# Patient Record
Sex: Male | Born: 1950
Health system: Southern US, Community
[De-identification: ages and names within clinical notes are randomized; demographics above are authoritative.]

## PROBLEM LIST (undated history)

## (undated) DIAGNOSIS — I10 Essential (primary) hypertension: Secondary | ICD-10-CM

## (undated) DIAGNOSIS — Z87891 Personal history of nicotine dependence: Secondary | ICD-10-CM

## (undated) DIAGNOSIS — E785 Hyperlipidemia, unspecified: Secondary | ICD-10-CM

## (undated) HISTORY — DX: Personal history of nicotine dependence: Z87.891

## (undated) HISTORY — PX: KNEE ARTHROSCOPY: SHX127

## (undated) HISTORY — PX: COLONOSCOPY: SHX174

## (undated) HISTORY — DX: Essential (primary) hypertension: I10

## (undated) HISTORY — PX: TONSILLECTOMY: SUR1361

---

## 2006-07-23 ENCOUNTER — Ambulatory Visit: Payer: Self-pay | Admitting: Gastroenterology

## 2009-09-09 ENCOUNTER — Ambulatory Visit: Payer: Self-pay | Admitting: Gastroenterology

## 2012-12-02 ENCOUNTER — Ambulatory Visit: Payer: Self-pay | Admitting: Gastroenterology

## 2012-12-03 LAB — PATHOLOGY REPORT

## 2013-09-18 DIAGNOSIS — T7840XA Allergy, unspecified, initial encounter: Secondary | ICD-10-CM

## 2013-09-18 HISTORY — DX: Allergy, unspecified, initial encounter: T78.40XA

## 2015-06-18 ENCOUNTER — Encounter: Payer: Self-pay | Admitting: *Deleted

## 2015-06-21 ENCOUNTER — Ambulatory Visit: Payer: Medicare Other | Admitting: Registered Nurse

## 2015-06-21 ENCOUNTER — Ambulatory Visit
Admission: RE | Admit: 2015-06-21 | Discharge: 2015-06-21 | Disposition: A | Discharge status: Home / Self Care | Payer: Medicare Other | Source: Ambulatory Visit | Attending: Gastroenterology | Admitting: Gastroenterology

## 2015-06-21 ENCOUNTER — Encounter
Admission: RE | Disposition: A | Discharge status: Home / Self Care | Payer: Self-pay | Source: Ambulatory Visit | Attending: Gastroenterology

## 2015-06-21 DIAGNOSIS — Z79899 Other long term (current) drug therapy: Secondary | ICD-10-CM | POA: Diagnosis not present

## 2015-06-21 DIAGNOSIS — K5730 Diverticulosis of large intestine without perforation or abscess without bleeding: Secondary | ICD-10-CM | POA: Insufficient documentation

## 2015-06-21 DIAGNOSIS — D124 Benign neoplasm of descending colon: Secondary | ICD-10-CM | POA: Diagnosis not present

## 2015-06-21 DIAGNOSIS — Z86010 Personal history of colonic polyps: Secondary | ICD-10-CM | POA: Insufficient documentation

## 2015-06-21 DIAGNOSIS — I10 Essential (primary) hypertension: Secondary | ICD-10-CM | POA: Insufficient documentation

## 2015-06-21 DIAGNOSIS — Z91030 Bee allergy status: Secondary | ICD-10-CM | POA: Insufficient documentation

## 2015-06-21 DIAGNOSIS — E785 Hyperlipidemia, unspecified: Secondary | ICD-10-CM | POA: Diagnosis not present

## 2015-06-21 DIAGNOSIS — K621 Rectal polyp: Secondary | ICD-10-CM | POA: Insufficient documentation

## 2015-06-21 DIAGNOSIS — D123 Benign neoplasm of transverse colon: Secondary | ICD-10-CM | POA: Diagnosis not present

## 2015-06-21 DIAGNOSIS — Z7982 Long term (current) use of aspirin: Secondary | ICD-10-CM | POA: Insufficient documentation

## 2015-06-21 HISTORY — DX: Hyperlipidemia, unspecified: E78.5

## 2015-06-21 HISTORY — DX: Essential (primary) hypertension: I10

## 2015-06-21 HISTORY — PX: COLONOSCOPY WITH PROPOFOL: SHX5780

## 2015-06-21 SURGERY — COLONOSCOPY WITH PROPOFOL
Anesthesia: General

## 2015-06-21 MED ORDER — SODIUM CHLORIDE 0.9 % IV SOLN: INTRAVENOUS | Status: DC

## 2015-06-21 MED ORDER — PROPOFOL 500 MG/50ML IV EMUL
INTRAVENOUS | Status: DC | PRN
  Administered 2015-06-21: 120 ug/kg/min via INTRAVENOUS

## 2015-06-21 MED ORDER — FENTANYL CITRATE (PF) 100 MCG/2ML IJ SOLN
INTRAMUSCULAR | Status: DC | PRN
  Administered 2015-06-21: 50 ug via INTRAVENOUS

## 2015-06-21 MED ORDER — MIDAZOLAM HCL 2 MG/2ML IJ SOLN: INTRAMUSCULAR | Status: DC | PRN

## 2015-06-21 MED ORDER — MIDAZOLAM HCL 2 MG/2ML IJ SOLN
INTRAMUSCULAR | Status: DC | PRN
  Administered 2015-06-21: 1 mg via INTRAVENOUS

## 2015-06-21 MED ORDER — SODIUM CHLORIDE 0.9 % IV SOLN
INTRAVENOUS | Status: DC
  Administered 2015-06-21: 1000 mL via INTRAVENOUS
  Administered 2015-06-21: 13:00:00 via INTRAVENOUS

## 2015-06-21 NOTE — Transfer of Care (Signed)
Immediate Anesthesia Transfer of Care Note  Patient: Jim Bauer  Procedure(s) Performed: Procedure(s): COLONOSCOPY WITH PROPOFOL (N/A)  Patient Location: PACU  Anesthesia Type:General  Level of Consciousness: awake  Airway & Oxygen Therapy: Patient Spontanous Breathing and Patient connected to nasal cannula oxygen  Post-op Assessment: Report given to RN  Post vital signs: stable  Last Vitals:  Filed Vitals:   06/21/15 1242  BP: 143/88  Pulse: 75  Temp: 36.2 C  Resp: 17    Complications: No apparent anesthesia complications

## 2015-06-21 NOTE — Op Note (Signed)
[facility] Gastroenterology Patient Name: Jim Bauer Procedure Date: 06/21/2015 1:06 PM MRN: [id number] Account #: [id number] Date of Birth: [birth year] Admit Type: Outpatient Age: 64 Room: [facility] ENDO ROOM 2 Gender: Male Note Status: Finalized Procedure:         Colonoscopy Indications:       Personal history of colonic polyps Providers:         Lollie Sails, MD Referring MD:      Ramonita Lab, MD (Referring MD) Medicines:         Monitored Anesthesia Care Complications:     No immediate complications. Procedure:         Pre-Anesthesia Assessment:                    - ASA Grade Assessment: II - A patient with mild systemic                     disease.                    After obtaining informed consent, the colonoscope was                     passed under direct vision. Throughout the procedure, the                     patient's blood pressure, pulse, and oxygen saturations                     were monitored continuously. The Colonoscope was                     introduced through the anus and advanced to the the cecum,                     identified by appendiceal orifice and ileocecal valve. The                     colonoscopy was performed without difficulty. The patient                     tolerated the procedure well. The quality of the bowel                     preparation was good. Findings:      Multiple small-mouthed diverticula were found in the sigmoid colon and       in the descending colon.      A 3 to 4 mm polyp was found in the descending colon. The polyp was flat.       The polyp was removed with a cold snare. Resection and retrieval were       complete.      Four sessile polyps were found at the splenic flexure. The polyps were 3       to 4 mm in size. These polyps were removed with a cold snare. Resection       and retrieval were complete.      A 1 mm polyp was found in the rectum. The polyp was sessile. The polyp       was removed with  a cold biopsy forceps. Resection and retrieval were       complete.      The retroflexed view of the distal rectum and anal verge was normal and  showed no anal or rectal abnormalities.      The digital rectal exam was normal. Impression:        - Diverticulosis in the sigmoid colon and in the                     descending colon.                    - One 3 to 4 mm polyp in the descending colon. Resected                     and retrieved.                    - Four 3 to 4 mm polyps at the splenic flexure. Resected                     and retrieved.                    - One 1 mm polyp in the rectum. Resected and retrieved.                    - The distal rectum and anal verge are normal on                     retroflexion view. Recommendation:    - Await pathology results.                    - Telephone GI clinic for pathology results in 1 week. Procedure Code(s): --- Professional ---                    [phone], Colonoscopy, flexible; with removal of tumor(s),                     polyp(s), or other lesion(s) by snare technique                    45380, 51, Colonoscopy, flexible; with biopsy, single or                     multiple Diagnosis Code(s): --- Professional ---                    211.3, Benign neoplasm of colon                    569.0, Anal and rectal polyp                    V12.72, Personal history of colonic polyps                    562.10, Diverticulosis of colon (without mention of                     hemorrhage) CPT copyright 2014 American Medical Association. All rights reserved. The codes documented in this report are preliminary and upon coder review may  be revised to meet current compliance requirements. Lollie Sails, MD 06/21/2015 1:42:39 PM This report has been signed electronically. Number of Addenda: 0 Note Initiated On: 06/21/2015 1:06 PM Scope Withdrawal Time: 0 hours 15 minutes 35 seconds  Total Procedure Duration: 0 hours 27 minutes 28 seconds        [facility]

## 2015-06-21 NOTE — H&P (Signed)
Outpatient short stay form Pre-procedure 06/21/2015 1:07 PM Lollie Sails MD  Primary Physician: Dr. Ramonita Lab  Reason for visit:  Colonoscopy  History of present illness:  Patient is a 64 year old male with a personal history of adenomatous colon polyps. His last colonoscopy was 12/02/2012 at which time several adenomas were removed one of these being 16 mm in size. He tolerated his prep well for the procedure. He takes no anticoagulation products. He does take a minidose aspirin.    Current facility-administered medications:  .  0.9 %  sodium chloride infusion, , Intravenous, Continuous, Lollie Sails, MD, Last Rate: 20 mL/hr at 06/21/15 1300, 1,000 mL at 06/21/15 1300 .  0.9 %  sodium chloride infusion, , Intravenous, Continuous, Lollie Sails, MD  Prescriptions prior to admission  Medication Sig Dispense Refill Last Dose  . aspirin EC 81 MG tablet Take 81 mg by mouth daily.     . calcium-vitamin D (OSCAL WITH D) 500-200 MG-UNIT tablet Take 1 tablet by mouth.     . Omega-3 Fatty Acids (OMEGA-3 FISH OIL) 300 MG CAPS Take by mouth.     . simvastatin (ZOCOR) 80 MG tablet Take 80 mg by mouth daily.        Allergies  Allergen Reactions  . Bee Venom Swelling     Past Medical History  Diagnosis Date  . Hypertension   . Hyperlipidemia     Review of systems:      Physical Exam    Heart and lungs: Regular rate and rhythm without rub or gallop, lungs are bilaterally clear    HEENT: Normocephalic atraumatic eyes are anicteric    Other:     Pertinant exam for procedure: Soft nontender nondistended bowel sounds positive normoactive    Planned proceedures: Colonoscopy and indicated procedures I have discussed the risks benefits and complications of procedures to include not limited to bleeding, infection, perforation and the risk of sedation and the patient wishes to proceed.    Lollie Sails, MD Gastroenterology 06/21/2015  1:07 PM

## 2015-06-21 NOTE — Anesthesia Preprocedure Evaluation (Signed)
Anesthesia Evaluation  Patient identified by MRN, date of birth, ID band Patient awake    Reviewed: Allergy & Precautions, H&P , NPO status , Patient's Chart, lab work & pertinent test results, reviewed documented beta blocker date and time   Airway Mallampati: II  TM Distance: >3 FB Neck ROM: full    Dental no notable dental hx.    Pulmonary neg pulmonary ROS, Current Smoker,    Pulmonary exam normal breath sounds clear to auscultation       Cardiovascular Exercise Tolerance: Good hypertension, negative cardio ROS   Rhythm:regular Rate:Normal     Neuro/Psych negative neurological ROS  negative psych ROS   GI/Hepatic negative GI ROS, Neg liver ROS,   Endo/Other  negative endocrine ROS  Renal/GU negative Renal ROS  negative genitourinary   Musculoskeletal   Abdominal   Peds  Hematology negative hematology ROS (+)   Anesthesia Other Findings   Reproductive/Obstetrics negative OB ROS                             Anesthesia Physical Anesthesia Plan  ASA: II  Anesthesia Plan: General   Post-op Pain Management:    Induction:   Airway Management Planned:   Additional Equipment:   Intra-op Plan:   Post-operative Plan:   Informed Consent: I have reviewed the patients History and Physical, chart, labs and discussed the procedure including the risks, benefits and alternatives for the proposed anesthesia with the patient or authorized representative who has indicated his/her understanding and acceptance.   Dental Advisory Given  Plan Discussed with: CRNA  Anesthesia Plan Comments:         Anesthesia Quick Evaluation

## 2015-06-22 ENCOUNTER — Encounter: Payer: Self-pay | Admitting: Gastroenterology

## 2015-06-22 LAB — SURGICAL PATHOLOGY

## 2015-06-22 NOTE — Anesthesia Postprocedure Evaluation (Signed)
  Anesthesia Post-op Note  Patient: Jim Bauer  Procedure(s) Performed: Procedure(s): COLONOSCOPY WITH PROPOFOL (N/A)  Anesthesia type:General  Patient location: PACU  Post pain: Pain level controlled  Post assessment: Post-op Vital signs reviewed, Patient's Cardiovascular Status Stable, Respiratory Function Stable, Patent Airway and No signs of Nausea or vomiting  Post vital signs: Reviewed and stable  Last Vitals:  Filed Vitals:   06/21/15 1410  BP: 155/78  Pulse: 57  Temp:   Resp: 13    Level of consciousness: awake, alert  and patient cooperative  Complications: No apparent anesthesia complications

## 2015-08-18 ENCOUNTER — Other Ambulatory Visit: Payer: Self-pay | Admitting: Internal Medicine

## 2015-08-18 DIAGNOSIS — Z136 Encounter for screening for cardiovascular disorders: Secondary | ICD-10-CM

## 2015-08-18 DIAGNOSIS — Z720 Tobacco use: Secondary | ICD-10-CM

## 2015-08-18 DIAGNOSIS — I10 Essential (primary) hypertension: Secondary | ICD-10-CM

## 2015-08-20 ENCOUNTER — Other Ambulatory Visit: Payer: Self-pay | Admitting: Family Medicine

## 2015-08-20 ENCOUNTER — Inpatient Hospital Stay: Payer: Medicare Other | Attending: Family Medicine | Admitting: Family Medicine

## 2015-08-20 ENCOUNTER — Encounter: Payer: Self-pay | Admitting: Family Medicine

## 2015-08-20 ENCOUNTER — Ambulatory Visit
Admission: RE | Admit: 2015-08-20 | Discharge: 2015-08-20 | Disposition: A | Discharge status: Home / Self Care | Payer: Medicare Other | Source: Ambulatory Visit | Attending: Family Medicine | Admitting: Family Medicine

## 2015-08-20 DIAGNOSIS — Z87891 Personal history of nicotine dependence: Secondary | ICD-10-CM

## 2015-08-20 DIAGNOSIS — Z122 Encounter for screening for malignant neoplasm of respiratory organs: Secondary | ICD-10-CM | POA: Diagnosis not present

## 2015-08-20 DIAGNOSIS — J439 Emphysema, unspecified: Secondary | ICD-10-CM | POA: Diagnosis not present

## 2015-08-20 DIAGNOSIS — I2510 Atherosclerotic heart disease of native coronary artery without angina pectoris: Secondary | ICD-10-CM | POA: Diagnosis not present

## 2015-08-20 HISTORY — DX: Personal history of nicotine dependence: Z87.891

## 2015-08-20 NOTE — Progress Notes (Signed)
In accordance with CMS guidelines, patient has meet eligibility criteria including age, absence of signs or symptoms of lung cancer, the specific calculation of cigarette smoking pack-years was 40 years and is a current smoker.   A shared decision-making session was conducted prior to the performance of CT scan. This includes one or more decision aids, includes benefits and harms of screening, follow-up diagnostic testing, over-diagnosis, false positive rate, and total radiation exposure.  Counseling on the importance of adherence to annual lung cancer LDCT screening, impact of co-morbidities, and ability or willingness to undergo diagnosis and treatment is imperative for compliance of the program.  Counseling on the importance of continued smoking cessation for former smokers; the importance of smoking cessation for current smokers and information about tobacco cessation interventions have been given to patient including the [place] at [facility], [address] quit [place], as well as Cancer Center specific smoking cessation programs.  Written order for lung cancer screening with LDCT has been given to the patient and any and all questions have been answered to the best of my abilities.   Yearly follow up will be scheduled by Shawn Perkins, Thoracic Navigator.   

## 2015-08-23 ENCOUNTER — Telehealth: Payer: Self-pay | Admitting: *Deleted

## 2015-08-23 NOTE — Telephone Encounter (Signed)
Notified patient of LDCT lung cancer screening results of Lung Rads 2S finding with recommendation for 12 month follow up imaging. Also notified of incidental finding noted below. Patient verbalizes understanding.   IMPRESSION: 1. Lung-RADS Category 2S, benign appearance or behavior. Continue annual screening with low-dose chest CT without contrast in 12 months. 2. The "S" modifier above refers to potentially clinically significant non lung cancer related findings. Specifically, there is atherosclerosis, including left anterior descending coronary artery disease. Please note that although the presence of coronary artery calcium documents the presence of coronary artery disease, the severity of this disease and any potential stenosis cannot be assessed on this non-gated CT examination. Assessment for potential risk factor modification, dietary therapy or pharmacologic therapy may be warranted, if clinically indicated. 3. Mild diffuse bronchial wall thickening with mild centrilobular and paraseptal emphysema; imaging findings suggestive of underlying COPD. 4. Additional incidental findings, as above.

## 2015-08-24 ENCOUNTER — Ambulatory Visit
Admission: RE | Admit: 2015-08-24 | Discharge: 2015-08-24 | Disposition: A | Discharge status: Home / Self Care | Payer: Medicare Other | Source: Ambulatory Visit | Attending: Internal Medicine | Admitting: Internal Medicine

## 2015-08-24 DIAGNOSIS — I10 Essential (primary) hypertension: Secondary | ICD-10-CM | POA: Diagnosis present

## 2015-08-24 DIAGNOSIS — Z136 Encounter for screening for cardiovascular disorders: Secondary | ICD-10-CM | POA: Diagnosis present

## 2015-08-24 DIAGNOSIS — I77811 Abdominal aortic ectasia: Secondary | ICD-10-CM | POA: Insufficient documentation

## 2015-08-24 DIAGNOSIS — Z720 Tobacco use: Secondary | ICD-10-CM | POA: Diagnosis present

## 2015-09-08 NOTE — Addendum Note (Signed)
Addendum  created 09/08/15 1304 by Iver Nestle, MD   Modules edited: Anesthesia Attestations

## 2016-08-17 ENCOUNTER — Telehealth: Payer: Self-pay | Admitting: *Deleted

## 2016-08-17 NOTE — Telephone Encounter (Signed)
Notified patient that annual lung cancer screening low dose CT scan is due. Confirmed that patient is within the age range of 55-77, and asymptomatic, (no signs or symptoms of lung cancer). Patient denies illness that would prevent curative treatment for lung cancer if found. The patient is a current smoker, with a 41 pack year history. The shared decision making visit was done 08/20/15. Patient is agreeable for CT scan being scheduled. Information sent to business office.

## 2016-09-14 ENCOUNTER — Other Ambulatory Visit: Payer: Self-pay | Admitting: *Deleted

## 2016-09-14 DIAGNOSIS — Z87891 Personal history of nicotine dependence: Secondary | ICD-10-CM

## 2016-10-04 ENCOUNTER — Ambulatory Visit: Admission: RE | Admit: 2016-10-04 | Payer: Medicare Other | Source: Ambulatory Visit

## 2016-10-11 ENCOUNTER — Ambulatory Visit
Admission: RE | Admit: 2016-10-11 | Discharge: 2016-10-11 | Disposition: A | Discharge status: Home / Self Care | Payer: Medicare Other | Source: Ambulatory Visit | Attending: Oncology | Admitting: Oncology

## 2016-10-11 DIAGNOSIS — J432 Centrilobular emphysema: Secondary | ICD-10-CM | POA: Insufficient documentation

## 2016-10-11 DIAGNOSIS — R918 Other nonspecific abnormal finding of lung field: Secondary | ICD-10-CM | POA: Insufficient documentation

## 2016-10-11 DIAGNOSIS — I2510 Atherosclerotic heart disease of native coronary artery without angina pectoris: Secondary | ICD-10-CM | POA: Insufficient documentation

## 2016-10-11 DIAGNOSIS — I700 Atherosclerosis of aorta: Secondary | ICD-10-CM | POA: Diagnosis not present

## 2016-10-11 DIAGNOSIS — Z87891 Personal history of nicotine dependence: Secondary | ICD-10-CM | POA: Insufficient documentation

## 2016-10-20 ENCOUNTER — Encounter: Payer: Self-pay | Admitting: *Deleted

## 2017-10-11 ENCOUNTER — Telehealth: Payer: Self-pay | Admitting: *Deleted

## 2017-10-11 DIAGNOSIS — Z87891 Personal history of nicotine dependence: Secondary | ICD-10-CM

## 2017-10-11 DIAGNOSIS — Z122 Encounter for screening for malignant neoplasm of respiratory organs: Secondary | ICD-10-CM

## 2017-10-11 NOTE — Telephone Encounter (Signed)
Notified patient that annual lung cancer screening low dose CT scan is due currently or will be in near future. Confirmed that patient is within the age range of 55-77, and asymptomatic, (no signs or symptoms of lung cancer). Patient denies illness that would prevent curative treatment for lung cancer if found. Verified smoking history, (current, 42 pack year). The shared decision making visit was done 08/20/15. Patient is agreeable for CT scan being scheduled.

## 2017-10-22 ENCOUNTER — Ambulatory Visit
Admission: RE | Admit: 2017-10-22 | Discharge: 2017-10-22 | Disposition: A | Discharge status: Home / Self Care | Payer: Medicare Other | Source: Ambulatory Visit | Attending: Nurse Practitioner | Admitting: Nurse Practitioner

## 2017-10-22 DIAGNOSIS — J439 Emphysema, unspecified: Secondary | ICD-10-CM | POA: Diagnosis not present

## 2017-10-22 DIAGNOSIS — I700 Atherosclerosis of aorta: Secondary | ICD-10-CM | POA: Insufficient documentation

## 2017-10-22 DIAGNOSIS — Z122 Encounter for screening for malignant neoplasm of respiratory organs: Secondary | ICD-10-CM | POA: Insufficient documentation

## 2017-10-22 DIAGNOSIS — Z87891 Personal history of nicotine dependence: Secondary | ICD-10-CM | POA: Diagnosis present

## 2017-10-29 ENCOUNTER — Encounter: Payer: Self-pay | Admitting: *Deleted

## 2018-10-11 ENCOUNTER — Telehealth: Payer: Self-pay

## 2018-10-11 NOTE — Telephone Encounter (Signed)
Call pt regarding lung screening. Pt is a current smoker, smoking between 1/2 to 1 pack per day. Scan would be better if on a Wednesday or Friday anytime. Pt has no new health issues at this time.

## 2018-10-12 ENCOUNTER — Telehealth: Payer: Self-pay | Admitting: *Deleted

## 2018-10-12 ENCOUNTER — Encounter: Payer: Self-pay | Admitting: *Deleted

## 2018-10-12 DIAGNOSIS — Z122 Encounter for screening for malignant neoplasm of respiratory organs: Secondary | ICD-10-CM

## 2018-10-12 NOTE — Telephone Encounter (Signed)
Patient has been notified that the annual lung cancer screening low dose CT scan is due currently or will be in the near future.  Confirmed that the patient is within the age range of 72-80, and asymptomatic, and currently exhibits no signs or symptoms of lung cancer.  Patient denies illness that would prevent curative treatment for lung cancer if found.  Verified smoking history, current smoker 1 ppd with 43pkyr history   The shared decision making visit was completed on 08-20-15.  Patient is agreeable for the CT scan to be scheduled.  Will call patient back with date and time of appointment.

## 2018-10-15 ENCOUNTER — Telehealth: Payer: Self-pay | Admitting: *Deleted

## 2018-10-15 NOTE — Telephone Encounter (Signed)
Called pt to inform him of his appt for ldct screening on Wednesday 10/23/2018 @ 1:35pm here @ [facility], voiced understanding.

## 2018-10-23 ENCOUNTER — Ambulatory Visit
Admission: RE | Admit: 2018-10-23 | Discharge: 2018-10-23 | Disposition: A | Discharge status: Home / Self Care | Payer: Medicare Other | Source: Ambulatory Visit | Attending: Oncology | Admitting: Oncology

## 2018-10-23 DIAGNOSIS — Z122 Encounter for screening for malignant neoplasm of respiratory organs: Secondary | ICD-10-CM | POA: Diagnosis present

## 2018-10-24 ENCOUNTER — Encounter: Payer: Self-pay | Admitting: *Deleted

## 2018-10-25 ENCOUNTER — Encounter: Payer: Self-pay | Admitting: *Deleted

## 2018-11-15 ENCOUNTER — Encounter: Payer: Self-pay | Admitting: *Deleted

## 2019-02-17 ENCOUNTER — Inpatient Hospital Stay: Admission: RE | Admit: 2019-02-17 | Payer: Medicare Other | Source: Ambulatory Visit

## 2019-02-17 ENCOUNTER — Telehealth: Payer: Medicare Other

## 2019-02-18 ENCOUNTER — Inpatient Hospital Stay: Admission: RE | Admit: 2019-02-18 | Payer: Medicare Other | Source: Ambulatory Visit

## 2019-02-18 ENCOUNTER — Ambulatory Visit (INDEPENDENT_AMBULATORY_CARE_PROVIDER_SITE_OTHER)
Admission: RE | Admit: 2019-02-18 | Discharge: 2019-02-18 | Disposition: A | Discharge status: Home / Self Care | Payer: Medicare Other | Source: Ambulatory Visit

## 2019-02-18 DIAGNOSIS — E785 Hyperlipidemia, unspecified: Secondary | ICD-10-CM | POA: Diagnosis not present

## 2019-02-18 MED ORDER — SIMVASTATIN 80 MG PO TABS: 80.0000 mg | ORAL_TABLET | Freq: Every day | ORAL | 0 refills | Status: DC

## 2019-02-18 NOTE — Discharge Instructions (Signed)
We refilled your cholesterol medication. I put 2 contacts on your discharge instructions for primary care follow-ups If you are unable to get in within the next 3 months to be established you can follow up with [place] as needed.

## 2019-02-18 NOTE — ED Provider Notes (Signed)
Virtual Visit via Video Note:  Josuel Koeppen  initiated request for Telemedicine visit with [facility] Urgent [facility] team. I connected with [name]  on 02/18/2019 at 2:14 PM  for a synchronized telemedicine visit using a video enabled HIPPA compliant telemedicine application. I verified that I am speaking with [name]  using two identifiers. Orvan July, NP  was physically located in a [facility] Urgent care site and Shaheim Mahar was located at a different location.   The limitations of evaluation and management by telemedicine as well as the availability of in-person appointments were discussed. Patient was informed that he  may incur a bill ( including co-pay) for this virtual visit encounter. Brady Peter  expressed understanding and gave verbal consent to proceed with virtual visit.     History of Present Illness:Jim Bauer  is a 68 y.o. male presents for medication refill.  He is out of his cholesterol medicine.  He takes 80 mg of simvastatin daily.  He currently does not have a PCP.  He denies any worrisome symptoms.  He reports overall feeling well.  He is requesting PCP referral  Past Medical History:  Diagnosis Date  . Hyperlipidemia   . Hypertension   . Personal history of tobacco use, presenting hazards to health 08/20/2015    Allergies  Allergen Reactions  . Bee Venom Swelling        Observations/Objective:GENERAL APPEARANCE: Well developed, well nourished, alert and cooperative, and appears to be in no acute distress. HEAD: normocephalic. Non labored breathing, no dyspnea or distress Skin: Skin normal color  PSYCHIATRIC: The mental examination revealed the patient was oriented to person, place, and time. The patient was able to demonstrate good judgement and reason, without hallucinations, abnormal affect or abnormal behaviors during the examination. Patient is not suicidal     Assessment and Plan: Refilled patient's cholesterol medication.  2  contacts placed on discharge instructions for PCP follow-up     Follow Up Instructions: Follow up as needed for continued or worsening symptoms     I discussed the assessment and treatment plan with the patient. The patient was provided an opportunity to ask questions and all were answered. The patient agreed with the plan and demonstrated an understanding of the instructions.   The patient was advised to call back or seek an in-person evaluation if the symptoms worsen or if the condition fails to improve as anticipated.    Orvan July, NP  02/18/2019 2:14 PM         Orvan July, NP 02/19/19 2952

## 2019-04-10 ENCOUNTER — Encounter: Payer: Self-pay | Admitting: Nurse Practitioner

## 2019-04-10 DIAGNOSIS — I10 Essential (primary) hypertension: Secondary | ICD-10-CM | POA: Insufficient documentation

## 2019-04-10 DIAGNOSIS — F17210 Nicotine dependence, cigarettes, uncomplicated: Secondary | ICD-10-CM | POA: Insufficient documentation

## 2019-04-10 DIAGNOSIS — R7303 Prediabetes: Secondary | ICD-10-CM | POA: Insufficient documentation

## 2019-04-10 DIAGNOSIS — I700 Atherosclerosis of aorta: Secondary | ICD-10-CM | POA: Insufficient documentation

## 2019-04-10 DIAGNOSIS — R918 Other nonspecific abnormal finding of lung field: Secondary | ICD-10-CM | POA: Insufficient documentation

## 2019-04-10 DIAGNOSIS — E785 Hyperlipidemia, unspecified: Secondary | ICD-10-CM | POA: Insufficient documentation

## 2019-04-10 DIAGNOSIS — E559 Vitamin D deficiency, unspecified: Secondary | ICD-10-CM | POA: Insufficient documentation

## 2019-04-10 DIAGNOSIS — Z860100 Personal history of colon polyps, unspecified: Secondary | ICD-10-CM | POA: Insufficient documentation

## 2019-04-21 ENCOUNTER — Ambulatory Visit (INDEPENDENT_AMBULATORY_CARE_PROVIDER_SITE_OTHER): Payer: Medicare Other | Admitting: Nurse Practitioner

## 2019-04-21 ENCOUNTER — Other Ambulatory Visit: Payer: Self-pay

## 2019-04-21 ENCOUNTER — Encounter: Payer: Self-pay | Admitting: Nurse Practitioner

## 2019-04-21 VITALS — BP 136/84 | HR 65 | Temp 99.2°F

## 2019-04-21 DIAGNOSIS — E782 Mixed hyperlipidemia: Secondary | ICD-10-CM | POA: Diagnosis not present

## 2019-04-21 DIAGNOSIS — F17210 Nicotine dependence, cigarettes, uncomplicated: Secondary | ICD-10-CM

## 2019-04-21 DIAGNOSIS — I700 Atherosclerosis of aorta: Secondary | ICD-10-CM

## 2019-04-21 DIAGNOSIS — Z1211 Encounter for screening for malignant neoplasm of colon: Secondary | ICD-10-CM

## 2019-04-21 DIAGNOSIS — I10 Essential (primary) hypertension: Secondary | ICD-10-CM | POA: Diagnosis not present

## 2019-04-21 DIAGNOSIS — R918 Other nonspecific abnormal finding of lung field: Secondary | ICD-10-CM | POA: Diagnosis not present

## 2019-04-21 MED ORDER — SIMVASTATIN 80 MG PO TABS: 80.0000 mg | ORAL_TABLET | Freq: Every day | ORAL | 3 refills | Status: DC

## 2019-04-21 NOTE — Assessment & Plan Note (Signed)
Noted on lung CT scan.  Continue daily ASA and Simvastatin.  Recommend complete smoking cessation.

## 2019-04-21 NOTE — Assessment & Plan Note (Signed)
I have recommended complete cessation of tobacco use. I have discussed various options available for assistance with tobacco cessation including over the counter methods (Nicotine gum, patch and lozenges). We also discussed prescription options (Chantix, Nicotine Inhaler / Nasal Spray). The patient is not interested in pursuing any prescription tobacco cessation options at this time.  

## 2019-04-21 NOTE — Patient Instructions (Signed)
Fat and Cholesterol Restricted Eating Plan Getting too much fat and cholesterol in your diet may cause health problems. Choosing the right foods helps keep your fat and cholesterol at normal levels. This can keep you from getting certain diseases. Your doctor may recommend an eating plan that includes:  Total fat: ______% or less of total calories a day.  Saturated fat: ______% or less of total calories a day.  Cholesterol: less than _________mg a day.  Fiber: ______g a day. What are tips for following this plan? Meal planning  At meals, divide your plate into four equal parts: ? Fill one-half of your plate with vegetables and green salads. ? Fill one-fourth of your plate with whole grains. ? Fill one-fourth of your plate with low-fat (lean) protein foods.  Eat fish that is high in omega-3 fats at least two times a week. This includes mackerel, tuna, sardines, and salmon.  Eat foods that are high in fiber, such as whole grains, beans, apples, broccoli, carrots, peas, and barley. General tips   Work with your doctor to lose weight if you need to.  Avoid: ? Foods with added sugar. ? Fried foods. ? Foods with partially hydrogenated oils.  Limit alcohol intake to no more than 1 drink a day for nonpregnant women and 2 drinks a day for men. One drink equals 12 oz of beer, 5 oz of wine, or 1 oz of hard liquor. Reading food labels  Check food labels for: ? Trans fats. ? Partially hydrogenated oils. ? Saturated fat (g) in each serving. ? Cholesterol (mg) in each serving. ? Fiber (g) in each serving.  Choose foods with healthy fats, such as: ? Monounsaturated fats. ? Polyunsaturated fats. ? Omega-3 fats.  Choose grain products that have whole grains. Look for the word "whole" as the first word in the ingredient list. Cooking  Cook foods using low-fat methods. These include baking, boiling, grilling, and broiling.  Eat more home-cooked foods. Eat at restaurants and buffets  less often.  Avoid cooking using saturated fats, such as butter, cream, palm oil, palm kernel oil, and coconut oil. Recommended foods  Fruits  All fresh, canned (in natural juice), or frozen fruits. Vegetables  Fresh or frozen vegetables (raw, steamed, roasted, or grilled). Green salads. Grains  Whole grains, such as whole wheat or whole grain breads, crackers, cereals, and pasta. Unsweetened oatmeal, bulgur, barley, quinoa, or brown rice. Corn or whole wheat flour tortillas. Meats and other protein foods  Ground beef (85% or leaner), grass-fed beef, or beef trimmed of fat. Skinless chicken or turkey. Ground chicken or turkey. Pork trimmed of fat. All fish and seafood. Egg whites. Dried beans, peas, or lentils. Unsalted nuts or seeds. Unsalted canned beans. Nut butters without added sugar or oil. Dairy  Low-fat or nonfat dairy products, such as skim or 1% milk, 2% or reduced-fat cheeses, low-fat and fat-free ricotta or cottage cheese, or plain low-fat and nonfat yogurt. Fats and oils  Tub margarine without trans fats. Light or reduced-fat mayonnaise and salad dressings. Avocado. Olive, canola, sesame, or safflower oils. The items listed above may not be a complete list of foods and beverages you can eat. Contact a dietitian for more information. Foods to avoid Fruits  Canned fruit in heavy syrup. Fruit in cream or butter sauce. Fried fruit. Vegetables  Vegetables cooked in cheese, cream, or butter sauce. Fried vegetables. Grains  White bread. White pasta. White rice. Cornbread. Bagels, pastries, and croissants. Crackers and snack foods that contain trans fat   and hydrogenated oils. Meats and other protein foods  Fatty cuts of meat. Ribs, chicken wings, bacon, sausage, bologna, salami, chitterlings, fatback, hot dogs, bratwurst, and packaged lunch meats. Liver and organ meats. Whole eggs and egg yolks. Chicken and turkey with skin. Fried meat. Dairy  Whole or 2% milk, cream,  half-and-half, and cream cheese. Whole milk cheeses. Whole-fat or sweetened yogurt. Full-fat cheeses. Nondairy creamers and whipped toppings. Processed cheese, cheese spreads, and cheese curds. Beverages  Alcohol. Sugar-sweetened drinks such as sodas, lemonade, and fruit drinks. Fats and oils  Butter, stick margarine, lard, shortening, ghee, or bacon fat. Coconut, palm kernel, and palm oils. Sweets and desserts  Corn syrup, sugars, honey, and molasses. Candy. Jam and jelly. Syrup. Sweetened cereals. Cookies, pies, cakes, donuts, muffins, and ice cream. The items listed above may not be a complete list of foods and beverages you should avoid. Contact a dietitian for more information. Summary  Choosing the right foods helps keep your fat and cholesterol at normal levels. This can keep you from getting certain diseases.  At meals, fill one-half of your plate with vegetables and green salads.  Eat high-fiber foods, like whole grains, beans, apples, carrots, peas, and barley.  Limit added sugar, saturated fats, alcohol, and fried foods. This information is not intended to replace advice given to you by your health care provider. Make sure you discuss any questions you have with your health care provider. Document Released: 03/05/2012 Document Revised: 05/08/2018 Document Reviewed: 05/22/2017 Elsevier Patient Education  2020 [employer]  

## 2019-04-21 NOTE — Assessment & Plan Note (Signed)
Followed yearly by nodule clinic.  Continue to monitor.  Recommend complete smoking cessation. 

## 2019-04-21 NOTE — Progress Notes (Signed)
New Patient Office Visit  Subjective:  Patient ID: Jim Bauer, male    DOB: 19-Dec-1950  Age: 68 y.o. MRN: [id number]  CC:  Chief Complaint  Patient presents with  . Establish Care    pt states he would like an order for his Colonoscopy, was due in 2019  . Hyperlipidemia    requesting RX for simvastatin for 90 days     HPI [name] presents for new patient visit to establish care.  Introduced to Designer, jewellery role and practice setting.  All questions answered.  Was previously being seen at [facility].   HYPERTENSION / HYPERLIPIDEMIA Not currently on BP medications, checks his BP daily.  Continues on Simvastatin for cholesterol. Continues to smoke less than 1 PPD.  Started smoking as a young teenager, has been smoking 50 + years.   Satisfied with current treatment? yes Duration of hypertension: chronic BP monitoring frequency: daily BP range: this morning 133/66, yesterday afternoon 110/60 BP medication side effects: no Duration of hyperlipidemia: chronic Cholesterol medication side effects: no Cholesterol supplements: none Medication compliance: good compliance Aspirin: yes Recent stressors: no Recurrent headaches: no Visual changes: no Palpitations: no Dyspnea: no Chest pain: no Lower extremity edema: no Dizzy/lightheaded: no   PULMONARY NODULES & PREVENTATIVE CARE: Followed by nodule clinic with last CT scan February 2020, to continue yearly screening.  Not interested in smoking cessation at this time.  Requests colonoscopy referral to someone other than [facility] practice.  Was due for this in February.  Past Medical History:  Diagnosis Date  . Hyperlipidemia   . Personal history of tobacco use, presenting hazards to health 08/20/2015    Past Surgical History:  Procedure Laterality Date  . COLONOSCOPY    . COLONOSCOPY WITH PROPOFOL N/A 06/21/2015   Procedure: COLONOSCOPY WITH PROPOFOL;  Surgeon: Lollie Sails, MD;  Location: [facility] ENDOSCOPY;  Service:  Endoscopy;  Laterality: N/A;  . KNEE ARTHROSCOPY    . TONSILLECTOMY      Family History  Problem Relation Age of Onset  . Macular degeneration Mother   . Diabetes Father   . Parkinson's disease Father   . Hypertension Father   . Obesity Brother   . Sleep apnea Brother   . Melanoma Daughter   . Diverticulitis Maternal Grandmother   . Alcohol abuse Maternal Grandfather   . Heart disease Paternal Grandmother   . Stroke Paternal Grandfather     Social History   Socioeconomic History  . Marital status: Widowed    Spouse name: Not on file  . Number of children: Not on file  . Years of education: Not on file  . Highest education level: Not on file  Occupational History  . Not on file  Social Needs  . Financial resource strain: Not on file  . Food insecurity    Worry: Not on file    Inability: Not on file  . Transportation needs    Medical: Not on file    Non-medical: Not on file  Tobacco Use  . Smoking status: Current Every Day Smoker    Packs/day: 1.00    Years: 43.00    Pack years: 43.00    Types: Cigarettes  . Smokeless tobacco: Never Used  Substance and Sexual Activity  . Alcohol use: Yes    Alcohol/week: 14.0 standard drinks    Types: 14 Glasses of wine per week  . Drug use: Never  . Sexual activity: Not on file  Lifestyle  . Physical activity    Days per  week: Not on file    Minutes per session: Not on file  . Stress: Not on file  Relationships  . Social Herbalist on phone: Not on file    Gets together: Not on file    Attends religious service: Not on file    Active member of club or organization: Not on file    Attends meetings of clubs or organizations: Not on file    Relationship status: Not on file  . Intimate partner violence    Fear of current or ex partner: Not on file    Emotionally abused: Not on file    Physically abused: Not on file    Forced sexual activity: Not on file  Other Topics Concern  . Not on file  Social History  Narrative  . Not on file    ROS Review of Systems  Constitutional: Negative for activity change, diaphoresis, fatigue and fever.  Respiratory: Negative for cough, chest tightness, shortness of breath and wheezing.   Cardiovascular: Negative for chest pain, palpitations and leg swelling.  Gastrointestinal: Negative for abdominal distention, abdominal pain, constipation, diarrhea, nausea and vomiting.  Endocrine: Negative for cold intolerance, heat intolerance, polydipsia, polyphagia and polyuria.  Musculoskeletal: Negative.   Skin: Negative.   Neurological: Negative for dizziness, syncope, weakness, light-headedness, numbness and headaches.  Psychiatric/Behavioral: Negative.     Objective:   Today's Vitals: BP 136/84 (BP Location: Left Arm, Patient Position: Sitting)   Pulse 65   Temp 99.2 F (37.3 C) (Oral)   SpO2 97%   Physical Exam Vitals signs and nursing note reviewed.  Constitutional:      General: He is awake. He is not in acute distress.    Appearance: He is well-developed and overweight. He is not ill-appearing.  HENT:     Head: Normocephalic and atraumatic.     Right Ear: Hearing normal. No drainage.     Left Ear: Hearing normal. No drainage.     Mouth/Throat:     Pharynx: Uvula midline.  Eyes:     General: Lids are normal.        Right eye: No discharge.        Left eye: No discharge.     Conjunctiva/sclera: Conjunctivae normal.     Pupils: Pupils are equal, round, and reactive to light.  Neck:     Musculoskeletal: Normal range of motion and neck supple.     Thyroid: No thyromegaly.     Vascular: No carotid bruit.     Trachea: Trachea normal.  Cardiovascular:     Rate and Rhythm: Normal rate and regular rhythm.     Heart sounds: Normal heart sounds, S1 normal and S2 normal. No murmur. No gallop.   Pulmonary:     Effort: Pulmonary effort is normal. No accessory muscle usage or respiratory distress.     Breath sounds: Normal breath sounds.  Abdominal:      General: Bowel sounds are normal.     Palpations: Abdomen is soft.  Musculoskeletal: Normal range of motion.     Right lower leg: No edema.     Left lower leg: No edema.  Skin:    General: Skin is warm and dry.     Capillary Refill: Capillary refill takes less than 2 seconds.     Findings: No rash.  Neurological:     Mental Status: He is alert and oriented to person, place, and time.     Deep Tendon Reflexes: Reflexes are normal and symmetric.  Psychiatric:        Mood and Affect: Mood normal.        Behavior: Behavior normal. Behavior is cooperative.        Thought Content: Thought content normal.        Judgment: Judgment normal.     Assessment & Plan:   Problem List Items Addressed This Visit      Cardiovascular and Mediastinum   Essential hypertension    Chronic, stable with home BP levels in goal range.  Suspect some level of white coat hypertension, as elevated on initial BP today but repeat at goal and similar to home.  Continue diet and exercise regimen.  No medications at this time.  Labs next visit.      Relevant Medications   simvastatin (ZOCOR) 80 MG tablet   Aortic atherosclerosis ([place]) - Primary    Noted on lung CT scan.  Continue daily ASA and Simvastatin.  Recommend complete smoking cessation.      Relevant Medications   simvastatin (ZOCOR) 80 MG tablet     Other   Pulmonary nodules    Followed yearly by nodule clinic.  Continue to monitor.  Recommend complete smoking cessation.      Hyperlipidemia    Chronic, ongoing.  Continue current medication regimen and adjust as needed.  Plan on lipid panel and CMP next visit.  Refills on Simvastatin sent.        Relevant Medications   simvastatin (ZOCOR) 80 MG tablet   Nicotine dependence, cigarettes, uncomplicated    I have recommended complete cessation of tobacco use. I have discussed various options available for assistance with tobacco cessation including over the counter methods (Nicotine gum, patch and  lozenges). We also discussed prescription options (Chantix, Nicotine Inhaler / Nasal Spray). The patient is not interested in pursuing any prescription tobacco cessation options at this time.       Other Visit Diagnoses    Colon cancer screening       Relevant Orders   Ambulatory referral to Gastroenterology      Outpatient Encounter Medications as of 04/21/2019  Medication Sig  . aspirin EC 81 MG tablet Take 81 mg by mouth daily.  . Cholecalciferol (D2000 ULTRA STRENGTH) 50 MCG (2000 UT) CAPS Take 2,000 Units by mouth daily.  . Omega 3 1000 MG CAPS Take 1 capsule by mouth daily.  . simvastatin (ZOCOR) 80 MG tablet Take 1 tablet (80 mg total) by mouth daily.  . [DISCONTINUED] simvastatin (ZOCOR) 80 MG tablet Take 1 tablet (80 mg total) by mouth daily.  . [DISCONTINUED] calcium-vitamin D (OSCAL WITH D) 500-200 MG-UNIT tablet Take 1 tablet by mouth.  . [DISCONTINUED] Omega-3 Fatty Acids (OMEGA-3 FISH OIL) 300 MG CAPS Take by mouth.   No facility-administered encounter medications on file as of 04/21/2019.     Follow-up: Return in about 4 weeks (around 05/19/2019) for annual physical.   Venita Lick, NP

## 2019-04-21 NOTE — Assessment & Plan Note (Signed)
Chronic, ongoing.  Continue current medication regimen and adjust as needed.  Plan on lipid panel and CMP next visit.  Refills on Simvastatin sent.

## 2019-04-21 NOTE — Assessment & Plan Note (Signed)
Chronic, stable with home BP levels in goal range.  Suspect some level of white coat hypertension, as elevated on initial BP today but repeat at goal and similar to home.  Continue diet and exercise regimen.  No medications at this time.  Labs next visit.

## 2019-04-22 ENCOUNTER — Other Ambulatory Visit: Payer: Self-pay

## 2019-04-22 ENCOUNTER — Telehealth: Payer: Self-pay

## 2019-04-22 DIAGNOSIS — Z86010 Personal history of colonic polyps: Secondary | ICD-10-CM

## 2019-04-22 DIAGNOSIS — Z1211 Encounter for screening for malignant neoplasm of colon: Secondary | ICD-10-CM

## 2019-04-22 MED ORDER — NA SULFATE-K SULFATE-MG SULF 17.5-3.13-1.6 GM/177ML PO SOLN: 1.0000 | Freq: Once | ORAL | 0 refills | Status: AC

## 2019-04-22 NOTE — Telephone Encounter (Signed)
Gastroenterology Pre-Procedure Review  Request Date: 05/07/19 Requesting Physician: Dr. Vicente Males  PATIENT REVIEW QUESTIONS: The patient responded to the following health history questions as indicated:    1. Are you having any GI issues? no 2. Do you have a personal history of Polyps? yes (2016) 3. Do you have a family history of Colon Cancer or Polyps? no 4. Diabetes Mellitus? no 5. Joint replacements in the past 12 months?no 6. Major health problems in the past 3 months?no 7. Any artificial heart valves, MVP, or defibrillator?no    MEDICATIONS & ALLERGIES:    Patient reports the following regarding taking any anticoagulation/antiplatelet therapy:   Plavix, Coumadin, Eliquis, Xarelto, Lovenox, Pradaxa, Brilinta, or Effient? no Aspirin? yes (yes 81 mg daily)  Patient confirms/reports the following medications:  Current Outpatient Medications  Medication Sig Dispense Refill  . aspirin EC 81 MG tablet Take 81 mg by mouth daily.    . Cholecalciferol (D2000 ULTRA STRENGTH) 50 MCG (2000 UT) CAPS Take 2,000 Units by mouth daily.    . Omega 3 1000 MG CAPS Take 1 capsule by mouth daily.    . simvastatin (ZOCOR) 80 MG tablet Take 1 tablet (80 mg total) by mouth daily. 90 tablet 3   No current facility-administered medications for this visit.     Patient confirms/reports the following allergies:  Allergies  Allergen Reactions  . Bee Venom Swelling    No orders of the defined types were placed in this encounter.   AUTHORIZATION INFORMATION Primary Insurance: 1D#: Group #:  Secondary Insurance: 1D#: Group #:  SCHEDULE INFORMATION: Date: 05/07/19 Time: Location:[facility]

## 2019-05-02 ENCOUNTER — Other Ambulatory Visit
Admission: RE | Admit: 2019-05-02 | Discharge: 2019-05-02 | Disposition: A | Discharge status: Home / Self Care | Payer: Medicare Other | Source: Ambulatory Visit | Attending: Gastroenterology | Admitting: Gastroenterology

## 2019-05-02 ENCOUNTER — Other Ambulatory Visit: Payer: Self-pay

## 2019-05-02 DIAGNOSIS — Z20828 Contact with and (suspected) exposure to other viral communicable diseases: Secondary | ICD-10-CM | POA: Diagnosis not present

## 2019-05-02 DIAGNOSIS — Z01812 Encounter for preprocedural laboratory examination: Secondary | ICD-10-CM | POA: Insufficient documentation

## 2019-05-02 LAB — SARS CORONAVIRUS 2 (TAT 6-24 HRS): SARS Coronavirus 2: NEGATIVE

## 2019-05-07 ENCOUNTER — Ambulatory Visit
Admission: RE | Admit: 2019-05-07 | Discharge: 2019-05-07 | Disposition: A | Discharge status: Home / Self Care | Payer: Medicare Other | Attending: Gastroenterology | Admitting: Gastroenterology

## 2019-05-07 ENCOUNTER — Encounter
Admission: RE | Disposition: A | Discharge status: Home / Self Care | Payer: Self-pay | Source: Home / Self Care | Attending: Gastroenterology

## 2019-05-07 ENCOUNTER — Ambulatory Visit: Payer: Medicare Other | Admitting: Certified Registered Nurse Anesthetist

## 2019-05-07 ENCOUNTER — Other Ambulatory Visit: Payer: Self-pay

## 2019-05-07 ENCOUNTER — Encounter: Payer: Self-pay | Admitting: *Deleted

## 2019-05-07 DIAGNOSIS — Z79899 Other long term (current) drug therapy: Secondary | ICD-10-CM | POA: Diagnosis not present

## 2019-05-07 DIAGNOSIS — K5730 Diverticulosis of large intestine without perforation or abscess without bleeding: Secondary | ICD-10-CM | POA: Insufficient documentation

## 2019-05-07 DIAGNOSIS — F17210 Nicotine dependence, cigarettes, uncomplicated: Secondary | ICD-10-CM | POA: Diagnosis not present

## 2019-05-07 DIAGNOSIS — Z1211 Encounter for screening for malignant neoplasm of colon: Secondary | ICD-10-CM

## 2019-05-07 DIAGNOSIS — D122 Benign neoplasm of ascending colon: Secondary | ICD-10-CM | POA: Insufficient documentation

## 2019-05-07 DIAGNOSIS — Z86010 Personal history of colonic polyps: Secondary | ICD-10-CM | POA: Diagnosis not present

## 2019-05-07 DIAGNOSIS — I10 Essential (primary) hypertension: Secondary | ICD-10-CM | POA: Diagnosis not present

## 2019-05-07 DIAGNOSIS — Z87891 Personal history of nicotine dependence: Secondary | ICD-10-CM | POA: Insufficient documentation

## 2019-05-07 DIAGNOSIS — E785 Hyperlipidemia, unspecified: Secondary | ICD-10-CM | POA: Insufficient documentation

## 2019-05-07 DIAGNOSIS — Z7982 Long term (current) use of aspirin: Secondary | ICD-10-CM | POA: Insufficient documentation

## 2019-05-07 DIAGNOSIS — J449 Chronic obstructive pulmonary disease, unspecified: Secondary | ICD-10-CM | POA: Diagnosis not present

## 2019-05-07 DIAGNOSIS — Z09 Encounter for follow-up examination after completed treatment for conditions other than malignant neoplasm: Secondary | ICD-10-CM | POA: Diagnosis present

## 2019-05-07 HISTORY — PX: COLONOSCOPY WITH PROPOFOL: SHX5780

## 2019-05-07 SURGERY — COLONOSCOPY WITH PROPOFOL
Anesthesia: General

## 2019-05-07 MED ORDER — PROPOFOL 500 MG/50ML IV EMUL
INTRAVENOUS | Status: AC
  Filled 2019-05-07: qty 50

## 2019-05-07 MED ORDER — SODIUM CHLORIDE 0.9 % IV SOLN
INTRAVENOUS | Status: DC
  Administered 2019-05-07: 13:00:00 via INTRAVENOUS

## 2019-05-07 MED ORDER — PROPOFOL 10 MG/ML IV BOLUS
INTRAVENOUS | Status: DC | PRN
  Administered 2019-05-07: 100 mg via INTRAVENOUS

## 2019-05-07 MED ORDER — PROPOFOL 500 MG/50ML IV EMUL
INTRAVENOUS | Status: DC | PRN
  Administered 2019-05-07: 130 ug/kg/min via INTRAVENOUS

## 2019-05-07 NOTE — Anesthesia Preprocedure Evaluation (Signed)
Anesthesia Evaluation  Patient identified by MRN, date of birth, ID band Patient awake    Reviewed: Allergy & Precautions, H&P , NPO status , Patient's Chart, lab work & pertinent test results  History of Anesthesia Complications Negative for: history of anesthetic complications  Airway Mallampati: III  TM Distance: <3 FB Neck ROM: limited    Dental  (+) Chipped, Poor Dentition   Pulmonary neg shortness of breath, COPD, Current Smoker and Patient abstained from smoking.,           Cardiovascular Exercise Tolerance: Good hypertension, (-) angina(-) Past MI      Neuro/Psych negative neurological ROS  negative psych ROS   GI/Hepatic negative GI ROS, Neg liver ROS, neg GERD  ,  Endo/Other  negative endocrine ROS  Renal/GU negative Renal ROS  negative genitourinary   Musculoskeletal   Abdominal   Peds  Hematology negative hematology ROS (+)   Anesthesia Other Findings Past Medical History: No date: Hyperlipidemia 08/20/2015: Personal history of tobacco use, presenting hazards to  health  Past Surgical History: No date: COLONOSCOPY 06/21/2015: COLONOSCOPY WITH PROPOFOL; N/A     Comment:  Procedure: COLONOSCOPY WITH PROPOFOL;  Surgeon: Lollie Sails, MD;  Location: [facility] ENDOSCOPY;  Service:               Endoscopy;  Laterality: N/A; No date: KNEE ARTHROSCOPY No date: TONSILLECTOMY  BMI    Body Mass Index: 29.41 kg/m      Reproductive/Obstetrics negative OB ROS                             Anesthesia Physical Anesthesia Plan  ASA: III  Anesthesia Plan: General   Post-op Pain Management:    Induction: Intravenous  PONV Risk Score and Plan: Propofol infusion and TIVA  Airway Management Planned: Natural Airway and Nasal Cannula  Additional Equipment:   Intra-op Plan:   Post-operative Plan:   Informed Consent: I have reviewed the patients History and  Physical, chart, labs and discussed the procedure including the risks, benefits and alternatives for the proposed anesthesia with the patient or authorized representative who has indicated his/her understanding and acceptance.     Dental Advisory Given  Plan Discussed with: Anesthesiologist, CRNA and Surgeon  Anesthesia Plan Comments: (Patient consented for risks of anesthesia including but not limited to:  - adverse reactions to medications - risk of intubation if required - damage to teeth, lips or other oral mucosa - sore throat or hoarseness - Damage to heart, brain, lungs or loss of life  Patient voiced understanding.)        Anesthesia Quick Evaluation

## 2019-05-07 NOTE — Anesthesia Postprocedure Evaluation (Signed)
Anesthesia Post Note  Patient: Raven Harmes  Procedure(s) Performed: COLONOSCOPY WITH PROPOFOL (N/A )  Patient location during evaluation: Endoscopy Anesthesia Type: General Level of consciousness: awake and alert and oriented Pain management: pain level controlled Vital Signs Assessment: post-procedure vital signs reviewed and stable Respiratory status: spontaneous breathing, nonlabored ventilation and respiratory function stable Cardiovascular status: blood pressure returned to baseline and stable Postop Assessment: no signs of nausea or vomiting Anesthetic complications: no     Last Vitals:  Vitals:   05/07/19 1408 05/07/19 1428  BP: (!) 142/89 (!) 176/78  Pulse:    Resp:    Temp:    SpO2:      Last Pain:  Vitals:   05/07/19 1428  TempSrc:   PainSc: 0-No pain                 Maddison Kilner

## 2019-05-07 NOTE — H&P (Signed)
Jonathon Bellows, MD [address] [place], [place], [place], [address] [address], [place], [place], [place], [address] Phone: [phone]  Fax: [phone]  Primary Care Physician:  Adin Hector, MD   Pre-Procedure History & Physical: HPI:  Jim Bauer is a 68 y.o. male is here for an colonoscopy.   Past Medical History:  Diagnosis Date  . Hyperlipidemia   . Personal history of tobacco use, presenting hazards to health 08/20/2015    Past Surgical History:  Procedure Laterality Date  . COLONOSCOPY    . COLONOSCOPY WITH PROPOFOL N/A 06/21/2015   Procedure: COLONOSCOPY WITH PROPOFOL;  Surgeon: Lollie Sails, MD;  Location: [facility] ENDOSCOPY;  Service: Endoscopy;  Laterality: N/A;  . KNEE ARTHROSCOPY    . TONSILLECTOMY      Prior to Admission medications   Medication Sig Start Date End Date Taking? Authorizing Provider  aspirin EC 81 MG tablet Take 81 mg by mouth daily.   Yes [provider]  Cholecalciferol (D2000 ULTRA STRENGTH) 50 MCG (2000 UT) CAPS Take 2,000 Units by mouth daily.   Yes [provider]  Omega 3 1000 MG CAPS Take 1 capsule by mouth daily.   Yes [provider]  simvastatin (ZOCOR) 80 MG tablet Take 1 tablet (80 mg total) by mouth daily. 04/21/19  Yes Marnee Guarneri T, NP    Allergies as of 04/22/2019 - Review Complete 04/21/2019  Allergen Reaction Noted  . Bee venom Swelling 06/21/2015    Family History  Problem Relation Age of Onset  . Macular degeneration Mother   . Diabetes Father   . Parkinson's disease Father   . Hypertension Father   . Obesity Brother   . Sleep apnea Brother   . Melanoma Daughter   . Diverticulitis Maternal Grandmother   . Alcohol abuse Maternal Grandfather   . Heart disease Paternal Grandmother   . Stroke Paternal Grandfather     Social History   Socioeconomic History  . Marital status: Widowed    Spouse name: Not on file  . Number of children: Not on file  . Years of education:  Not on file  . Highest education level: Not on file  Occupational History  . Not on file  Social Needs  . Financial resource strain: Not on file  . Food insecurity    Worry: Not on file    Inability: Not on file  . Transportation needs    Medical: Not on file    Non-medical: Not on file  Tobacco Use  . Smoking status: Current Every Day Smoker    Packs/day: 1.00    Years: 43.00    Pack years: 43.00    Types: Cigarettes  . Smokeless tobacco: Never Used  Substance and Sexual Activity  . Alcohol use: Yes    Alcohol/week: 14.0 standard drinks    Types: 14 Glasses of wine per week  . Drug use: Never  . Sexual activity: Not on file  Lifestyle  . Physical activity    Days per week: Not on file    Minutes per session: Not on file  . Stress: Not on file  Relationships  . Social Herbalist on phone: Not on file    Gets together: Not on file    Attends religious service: Not on file    Active member of club or organization: Not on file    Attends meetings of clubs or organizations: Not on file    Relationship status: Not on  file  . Intimate partner violence    Fear of current or ex partner: Not on file    Emotionally abused: Not on file    Physically abused: Not on file    Forced sexual activity: Not on file  Other Topics Concern  . Not on file  Social History Narrative  . Not on file    Review of Systems: See HPI, otherwise negative ROS  Physical Exam: BP (!) 132/98   Pulse 78   Temp (!) 96.7 F (35.9 C) (Tympanic)   Resp 16   Ht 5\' 10"  (1.778 m)   Wt 93 kg   SpO2 94%   BMI 29.41 kg/m  General:   Alert,  pleasant and cooperative in NAD Head:  Normocephalic and atraumatic. Neck:  Supple; no masses or thyromegaly. Lungs:  Clear throughout to auscultation, normal respiratory effort.    Heart:  +S1, +S2, Regular rate and rhythm, No edema. Abdomen:  Soft, nontender and nondistended. Normal bowel sounds, without guarding, and without rebound.    Neurologic:  Alert and  oriented x4;  grossly normal neurologically.  Impression/Plan: Jim Bauer is here for an colonoscopy to be performed for surveillance due to prior history of colon polyps   Risks, benefits, limitations, and alternatives regarding  colonoscopy have been reviewed with the patient.  Questions have been answered.  All parties agreeable.   Jonathon Bellows, MD  05/07/2019, 1:13 PM

## 2019-05-07 NOTE — Op Note (Signed)
[facility] Gastroenterology Patient Name: Jim Bauer Procedure Date: 05/07/2019 1:20 PM MRN: [id number] Account #: [id number] Date of Birth: 10-02-1950 Admit Type: Outpatient Age: 68 Room: [facility] ENDO ROOM 3 Gender: Male Note Status: Finalized Procedure:            Colonoscopy Indications:          High risk colon cancer surveillance: Personal history                        of colonic polyps Providers:            Jonathon Bellows MD, MD Referring MD:         Ramonita Lab, MD (Referring MD) Medicines:            Monitored Anesthesia Care Complications:        No immediate complications. Procedure:            Pre-Anesthesia Assessment:                       - Prior to the procedure, a History and Physical was                        performed, and patient medications, allergies and                        sensitivities were reviewed. The patient's tolerance of                        previous anesthesia was reviewed.                       - The risks and benefits of the procedure and the                        sedation options and risks were discussed with the                        patient. All questions were answered and informed                        consent was obtained.                       - ASA Grade Assessment: II - A patient with mild                        systemic disease.                       After obtaining informed consent, the colonoscope was                        passed under direct vision. Throughout the procedure,                        the patient's blood pressure, pulse, and oxygen                        saturations were monitored continuously. The                        Colonoscope  was introduced through the anus and                        advanced to the the cecum, identified by the                        appendiceal orifice. The colonoscopy was performed with                        ease. The patient tolerated the procedure well. The             quality of the bowel preparation was excellent. Findings:      The perianal and digital rectal examinations were normal.      Four sessile polyps were found in the ascending colon. The polyps were 4       to 7 mm in size. These polyps were removed with a cold snare. Resection       and retrieval were complete.      A 4 mm polyp was found in the ascending colon. The polyp was sessile.       The polyp was removed with a cold biopsy forceps. Resection and       retrieval were complete.      Multiple small-mouthed diverticula were found in the sigmoid colon.      The exam was otherwise without abnormality on direct and retroflexion       views. Impression:           - Four 4 to 7 mm polyps in the ascending colon, removed                        with a cold snare. Resected and retrieved.                       - One 4 mm polyp in the ascending colon, removed with a                        cold biopsy forceps. Resected and retrieved.                       - Diverticulosis in the sigmoid colon.                       - The examination was otherwise normal on direct and                        retroflexion views. Recommendation:       - Discharge patient to home (with escort).                       - Resume previous diet.                       - Continue present medications.                       - Await pathology results.                       - Repeat colonoscopy for surveillance based on  pathology results. Procedure Code(s):    --- Professional ---                       [phone], Colonoscopy, flexible; with removal of tumor(s),                        polyp(s), or other lesion(s) by snare technique                       45380, 84, Colonoscopy, flexible; with biopsy, single                        or multiple Diagnosis Code(s):    --- Professional ---                       K63.5, Polyp of colon                       Z86.010, Personal history of colonic polyps                        K57.30, Diverticulosis of large intestine without                        perforation or abscess without bleeding CPT copyright 2019 American Medical Association. All rights reserved. The codes documented in this report are preliminary and upon coder review may  be revised to meet current compliance requirements. Jonathon Bellows, MD Jonathon Bellows MD, MD 05/07/2019 1:56:09 PM This report has been signed electronically. Number of Addenda: 0 Note Initiated On: 05/07/2019 1:20 PM Scope Withdrawal Time: 0 hours 20 minutes 17 seconds  Total Procedure Duration: 0 hours 22 minutes 4 seconds  Estimated Blood Loss: Estimated blood loss: none.      [facility]

## 2019-05-07 NOTE — Transfer of Care (Signed)
Immediate Anesthesia Transfer of Care Note  Patient: Jim Bauer  Procedure(s) Performed: COLONOSCOPY WITH PROPOFOL (N/A )  Patient Location: Endoscopy Unit  Anesthesia Type:General  Level of Consciousness: drowsy and patient cooperative  Airway & Oxygen Therapy: Patient Spontanous Breathing  Post-op Assessment: Report given to RN and Post -op Vital signs reviewed and stable  Post vital signs: Reviewed and stable  Last Vitals:  Vitals Value Taken Time  BP 97/64 05/07/19 1359  Temp 36.1 C 05/07/19 1358  Pulse 61 05/07/19 1401  Resp 16 05/07/19 1401  SpO2 96 % 05/07/19 1401  Vitals shown include unvalidated device data.  Last Pain:  Vitals:   05/07/19 1358  TempSrc: Tympanic  PainSc: Asleep         Complications: No apparent anesthesia complications

## 2019-05-07 NOTE — Anesthesia Post-op Follow-up Note (Signed)
Anesthesia QCDR form completed.        

## 2019-05-08 ENCOUNTER — Encounter: Payer: Self-pay | Admitting: Gastroenterology

## 2019-05-09 LAB — SURGICAL PATHOLOGY

## 2019-05-11 ENCOUNTER — Encounter: Payer: Self-pay | Admitting: Gastroenterology

## 2019-06-18 ENCOUNTER — Ambulatory Visit (INDEPENDENT_AMBULATORY_CARE_PROVIDER_SITE_OTHER): Payer: Medicare Other | Admitting: Nurse Practitioner

## 2019-06-18 ENCOUNTER — Encounter: Payer: Self-pay | Admitting: Nurse Practitioner

## 2019-06-18 ENCOUNTER — Other Ambulatory Visit: Payer: Self-pay

## 2019-06-18 VITALS — BP 153/92 | HR 67 | Temp 98.5°F | Ht 69.5 in | Wt 203.0 lb

## 2019-06-18 DIAGNOSIS — E782 Mixed hyperlipidemia: Secondary | ICD-10-CM | POA: Diagnosis not present

## 2019-06-18 DIAGNOSIS — I10 Essential (primary) hypertension: Secondary | ICD-10-CM

## 2019-06-18 DIAGNOSIS — R7303 Prediabetes: Secondary | ICD-10-CM | POA: Diagnosis not present

## 2019-06-18 DIAGNOSIS — F17210 Nicotine dependence, cigarettes, uncomplicated: Secondary | ICD-10-CM

## 2019-06-18 DIAGNOSIS — I700 Atherosclerosis of aorta: Secondary | ICD-10-CM | POA: Diagnosis not present

## 2019-06-18 DIAGNOSIS — E559 Vitamin D deficiency, unspecified: Secondary | ICD-10-CM

## 2019-06-18 DIAGNOSIS — Z23 Encounter for immunization: Secondary | ICD-10-CM | POA: Diagnosis not present

## 2019-06-18 NOTE — Assessment & Plan Note (Signed)
Recommend complete cessation of smoking.  Continue daily statin and ASA.

## 2019-06-18 NOTE — Assessment & Plan Note (Signed)
I have recommended complete cessation of tobacco use. I have discussed various options available for assistance with tobacco cessation including over the counter methods (Nicotine gum, patch and lozenges). We also discussed prescription options (Chantix, Nicotine Inhaler / Nasal Spray). The patient is not interested in pursuing any prescription tobacco cessation options at this time.  

## 2019-06-18 NOTE — Progress Notes (Signed)
BP (!) 153/92   Pulse 67   Temp 98.5 F (36.9 C) (Oral)   Ht 5' 9.5" (1.765 m)   Wt 203 lb (92.1 kg)   SpO2 97%   BMI 29.55 kg/m    Subjective:    Patient ID: Jim Bauer, male    DOB: 22-Jul-1951, 68 y.o.   MRN: [id number]  HPI: Jim Bauer is a 68 y.o. male  Chief Complaint  Patient presents with  . Hypertension  . Hyperlipidemia   HYPERTENSION / HYPERLIPIDEMIA Continues on Simvastatin 80 MG and ASA. Currently continues to smoke 1 PPD, does attend yearly CT scans for screening.   Satisfied with current treatment? yes Duration of hypertension: chronic BP monitoring frequency: daily BP range: 105/48 to 134/69 BP medication side effects: no Duration of hyperlipidemia: chronic Cholesterol medication side effects: no, Cholesterol supplements: fish oil Medication compliance: good compliance Aspirin: yes Recent stressors: no Recurrent headaches: no Visual changes: no Palpitations: no Dyspnea: no Chest pain: no Lower extremity edema: no Dizzy/lightheaded: no   VITAMIN D DEFICIENCY: Continues on daily supplement.  No recent falls or fractures.    PREDIABETES: A1C ranging from 5.7 to 5.9 on review of past labs.  No current medications, has been focused on diet. Polydipsia/polyuria: no Visual disturbance: no Chest pain: no Paresthesias: no  Relevant past medical, surgical, family and social history reviewed and updated as indicated. Interim medical history since our last visit reviewed. Allergies and medications reviewed and updated.  Review of Systems  Constitutional: Negative for activity change, diaphoresis, fatigue and fever.  Respiratory: Negative for cough, chest tightness, shortness of breath and wheezing.   Cardiovascular: Negative for chest pain, palpitations and leg swelling.  Gastrointestinal: Negative for abdominal distention, abdominal pain, constipation, diarrhea, nausea and vomiting.  Endocrine: Negative for cold intolerance, heat intolerance,  polydipsia, polyphagia and polyuria.  Neurological: Negative for dizziness, syncope, weakness, light-headedness, numbness and headaches.  Psychiatric/Behavioral: Negative.     Per HPI unless specifically indicated above     Objective:    BP (!) 153/92   Pulse 67   Temp 98.5 F (36.9 C) (Oral)   Ht 5' 9.5" (1.765 m)   Wt 203 lb (92.1 kg)   SpO2 97%   BMI 29.55 kg/m   Wt Readings from Last 3 Encounters:  06/18/19 203 lb (92.1 kg)  05/07/19 205 lb (93 kg)  10/23/18 205 lb 8 oz (93.2 kg)    Physical Exam Vitals signs and nursing note reviewed.  Constitutional:      General: He is awake. He is not in acute distress.    Appearance: He is well-developed. He is not ill-appearing.  HENT:     Head: Normocephalic and atraumatic.     Right Ear: Hearing, tympanic membrane, ear canal and external ear normal. No drainage.     Left Ear: Hearing, tympanic membrane, ear canal and external ear normal. No drainage.  Eyes:     General: Lids are normal.        Right eye: No discharge.        Left eye: No discharge.     Conjunctiva/sclera: Conjunctivae normal.     Pupils: Pupils are equal, round, and reactive to light.  Neck:     Musculoskeletal: Normal range of motion and neck supple.     Thyroid: No thyromegaly.     Vascular: No carotid bruit.  Cardiovascular:     Rate and Rhythm: Normal rate and regular rhythm.     Heart sounds: Normal heart sounds,  S1 normal and S2 normal. No murmur. No gallop.   Pulmonary:     Effort: Pulmonary effort is normal. No accessory muscle usage or respiratory distress.     Breath sounds: Normal breath sounds.  Abdominal:     General: Bowel sounds are normal.     Palpations: Abdomen is soft. There is no hepatomegaly or splenomegaly.     Tenderness: There is no abdominal tenderness.     Hernia: There is no hernia in the left inguinal area or right inguinal area.  Musculoskeletal: Normal range of motion.     Right lower leg: No edema.     Left lower leg:  No edema.  Lymphadenopathy:     Cervical: No cervical adenopathy.  Skin:    General: Skin is warm and dry.     Capillary Refill: Capillary refill takes less than 2 seconds.     Findings: No rash.  Neurological:     Mental Status: He is alert and oriented to person, place, and time.     Cranial Nerves: Cranial nerves are intact.     Gait: Gait is intact.     Deep Tendon Reflexes: Reflexes are normal and symmetric.     Reflex Scores:      Brachioradialis reflexes are 2+ on the right side and 2+ on the left side.      Patellar reflexes are 2+ on the right side and 2+ on the left side. Psychiatric:        Attention and Perception: Attention normal.        Mood and Affect: Mood normal.        Speech: Speech normal.        Behavior: Behavior normal. Behavior is cooperative.        Thought Content: Thought content normal.        Judgment: Judgment normal.     Results for orders placed or performed during the hospital encounter of 05/07/19  Surgical pathology  Result Value Ref Range   SURGICAL PATHOLOGY      Surgical Pathology CASE: [id number] PATIENT: [name] Surgical Pathology Report     SPECIMEN SUBMITTED: A. Colon polyp x5, ascending; cold snare  CLINICAL HISTORY: None provided  PRE-OPERATIVE DIAGNOSIS: Screening colonoscopy, history of colon polyps  POST-OPERATIVE DIAGNOSIS: Diverticulosis, polyps     DIAGNOSIS: A. COLON POLYP X5, ASCENDING; COLD SNARE: - TUBULAR ADENOMA, FIVE FRAGMENTS. - NEGATIVE FOR HIGH-GRADE DYSPLASIA AND MALIGNANCY.   GROSS DESCRIPTION: A. Labeled: Cold snare ascending colon polyp x5 Received: In formalin Tissue fragment(s): Multiple Size: Aggregate, 1.5 x 1.2 x 0.2 cm Description: Tan soft tissue fragments Entirely submitted in 1 cassette.   Final Diagnosis performed by Betsy Pries, MD.   Electronically signed 05/09/2019 8:23:17AM The electronic signature indicates that the named Attending Pathologist has evaluated  the specimen  Technical component performed at [facility], [address], [place], [place] [address]  Lab: [phone] Dir: Rush Farmer, MD, MMM  Professional component performed at [facility], [facility], [place], [place], [place] [address] Lab: [phone] Dir: Dellia Nims. Rubinas, MD       Assessment & Plan:   Problem List Items Addressed This Visit      Cardiovascular and Mediastinum   Essential hypertension    Chronic, ongoing with elevation in BP today but home readings significantly below goal at baseline.  Will continue focus on DASH diet at home and recommend complete smoking cessation.  Initiate medication as needed if home readings elevated.  He documents BP  consistently.  CMP today.  Return in 3 months.      Relevant Orders   CBC with Differential/Platelet   Aortic atherosclerosis ([place]) - Primary    Recommend complete cessation of smoking.  Continue daily statin and ASA.        Other   Prediabetes    Recheck A1C today and adjust plan of care as needed.      Relevant Orders   HgB A1c   Vitamin D deficiency    Continue supplement and recheck Vit D level.      Relevant Orders   Vitamin D (25 hydroxy)   Hyperlipidemia    Chronic, ongoing.  Continue current medication regimen.  Check lipid panel and CMP today.      Relevant Orders   Comprehensive metabolic panel   Lipid Panel w/o Chol/HDL Ratio   Nicotine dependence, cigarettes, uncomplicated    I have recommended complete cessation of tobacco use. I have discussed various options available for assistance with tobacco cessation including over the counter methods (Nicotine gum, patch and lozenges). We also discussed prescription options (Chantix, Nicotine Inhaler / Nasal Spray). The patient is not interested in pursuing any prescription tobacco cessation options at this time.       Other Visit Diagnoses    Flu vaccine need       Relevant Orders   Flu Vaccine QUAD High Dose(Fluad)  (Completed)   Need for pneumococcal vaccination       Relevant Orders   Pneumococcal polysaccharide vaccine 23-valent greater than or equal to 2yo subcutaneous/IM (Completed)      Time: 25 minutes, >50% spent counseling/or care coordination on chronic health issues and smoking cessation  Follow up plan: Return in about 3 months (around 09/17/2019) for Physical.

## 2019-06-18 NOTE — Assessment & Plan Note (Signed)
Chronic, ongoing with elevation in BP today but home readings significantly below goal at baseline.  Will continue focus on DASH diet at home and recommend complete smoking cessation.  Initiate medication as needed if home readings elevated.  He documents BP consistently.  CMP today.  Return in 3 months.

## 2019-06-18 NOTE — Assessment & Plan Note (Signed)
Recheck A1C today and adjust plan of care as needed.

## 2019-06-18 NOTE — Assessment & Plan Note (Signed)
Chronic, ongoing.  Continue current medication regimen.  Check lipid panel and CMP today.

## 2019-06-18 NOTE — Patient Instructions (Signed)
Carbohydrate Counting for Diabetes Mellitus, Adult  Carbohydrate counting is a method of keeping track of how many carbohydrates you eat. Eating carbohydrates naturally increases the amount of sugar (glucose) in the blood. Counting how many carbohydrates you eat helps keep your blood glucose within normal limits, which helps you manage your diabetes (diabetes mellitus). It is important to know how many carbohydrates you can safely have in each meal. This is different for every person. A diet and nutrition specialist (registered dietitian) can help you make a meal plan and calculate how many carbohydrates you should have at each meal and snack. Carbohydrates are found in the following foods:  Grains, such as breads and cereals.  Dried beans and soy products.  Starchy vegetables, such as potatoes, peas, and corn.  Fruit and fruit juices.  Milk and yogurt.  Sweets and snack foods, such as cake, cookies, candy, chips, and soft drinks. How do I count carbohydrates? There are two ways to count carbohydrates in food. You can use either of the methods or a combination of both. Reading "Nutrition Facts" on packaged food The "Nutrition Facts" list is included on the labels of almost all packaged foods and beverages in the [place] It includes:  The serving size.  Information about nutrients in each serving, including the grams (g) of carbohydrate per serving. To use the "Nutrition Facts":  Decide how many servings you will have.  Multiply the number of servings by the number of carbohydrates per serving.  The resulting number is the total amount of carbohydrates that you will be having. Learning standard serving sizes of other foods When you eat carbohydrate foods that are not packaged or do not include "Nutrition Facts" on the label, you need to measure the servings in order to count the amount of carbohydrates:  Measure the foods that you will eat with a food scale or measuring cup, if needed.   Decide how many standard-size servings you will eat.  Multiply the number of servings by 15. Most carbohydrate-rich foods have about 15 g of carbohydrates per serving. ? For example, if you eat 8 oz (170 g) of strawberries, you will have eaten 2 servings and 30 g of carbohydrates (2 servings x 15 g = 30 g).  For foods that have more than one food mixed, such as soups and casseroles, you must count the carbohydrates in each food that is included. The following list contains standard serving sizes of common carbohydrate-rich foods. Each of these servings has about 15 g of carbohydrates:   hamburger bun or  English muffin.   oz (15 mL) syrup.   oz (14 g) jelly.  1 slice of bread.  1 six-inch tortilla.  3 oz (85 g) cooked rice or pasta.  4 oz (113 g) cooked dried beans.  4 oz (113 g) starchy vegetable, such as peas, corn, or potatoes.  4 oz (113 g) hot cereal.  4 oz (113 g) mashed potatoes or  of a large baked potato.  4 oz (113 g) canned or frozen fruit.  4 oz (120 mL) fruit juice.  4-6 crackers.  6 chicken nuggets.  6 oz (170 g) unsweetened dry cereal.  6 oz (170 g) plain fat-free yogurt or yogurt sweetened with artificial sweeteners.  8 oz (240 mL) milk.  8 oz (170 g) fresh fruit or one small piece of fruit.  24 oz (680 g) popped popcorn. Example of carbohydrate counting Sample meal  3 oz (85 g) chicken breast.  6 oz (170 g)   brown rice.  4 oz (113 g) corn.  8 oz (240 mL) milk.  8 oz (170 g) strawberries with sugar-free whipped topping. Carbohydrate calculation 1. Identify the foods that contain carbohydrates: ? Rice. ? Corn. ? Milk. ? Strawberries. 2. Calculate how many servings you have of each food: ? 2 servings rice. ? 1 serving corn. ? 1 serving milk. ? 1 serving strawberries. 3. Multiply each number of servings by 15 g: ? 2 servings rice x 15 g = 30 g. ? 1 serving corn x 15 g = 15 g. ? 1 serving milk x 15 g = 15 g. ? 1 serving  strawberries x 15 g = 15 g. 4. Add together all of the amounts to find the total grams of carbohydrates eaten: ? 30 g + 15 g + 15 g + 15 g = 75 g of carbohydrates total. Summary  Carbohydrate counting is a method of keeping track of how many carbohydrates you eat.  Eating carbohydrates naturally increases the amount of sugar (glucose) in the blood.  Counting how many carbohydrates you eat helps keep your blood glucose within normal limits, which helps you manage your diabetes.  A diet and nutrition specialist (registered dietitian) can help you make a meal plan and calculate how many carbohydrates you should have at each meal and snack. This information is not intended to replace advice given to you by your health care provider. Make sure you discuss any questions you have with your health care provider. Document Released: 09/04/2005 Document Revised: 03/29/2017 Document Reviewed: 02/16/2016 Elsevier Patient Education  2020 Elsevier Inc[employer]  

## 2019-06-18 NOTE — Assessment & Plan Note (Signed)
Continue supplement and recheck Vit D level.

## 2019-06-19 LAB — CBC WITH DIFFERENTIAL/PLATELET
Basophils Absolute: 0 10*3/uL (ref 0.0–0.2)
Basos: 0 %
EOS (ABSOLUTE): 0.1 10*3/uL (ref 0.0–0.4)
Eos: 1 %
Hematocrit: 49.1 % (ref 37.5–51.0)
Hemoglobin: 16.7 g/dL (ref 13.0–17.7)
Immature Grans (Abs): 0 10*3/uL (ref 0.0–0.1)
Immature Granulocytes: 0 %
Lymphocytes Absolute: 2.4 10*3/uL (ref 0.7–3.1)
Lymphs: 39 %
MCH: 29.8 pg (ref 26.6–33.0)
MCHC: 34 g/dL (ref 31.5–35.7)
MCV: 88 fL (ref 79–97)
Monocytes Absolute: 0.7 10*3/uL (ref 0.1–0.9)
Monocytes: 11 %
Neutrophils Absolute: 2.9 10*3/uL (ref 1.4–7.0)
Neutrophils: 49 %
Platelets: 219 10*3/uL (ref 150–450)
RBC: 5.61 x10E6/uL (ref 4.14–5.80)
RDW: 13.7 % (ref 11.6–15.4)
WBC: 6.1 10*3/uL (ref 3.4–10.8)

## 2019-06-19 LAB — COMPREHENSIVE METABOLIC PANEL
ALT: 28 IU/L (ref 0–44)
AST: 24 IU/L (ref 0–40)
Albumin/Globulin Ratio: 3 — ABNORMAL HIGH (ref 1.2–2.2)
Albumin: 5.1 g/dL — ABNORMAL HIGH (ref 3.8–4.8)
Alkaline Phosphatase: 98 IU/L (ref 39–117)
BUN/Creatinine Ratio: 25 — ABNORMAL HIGH (ref 10–24)
BUN: 24 mg/dL (ref 8–27)
Bilirubin Total: 0.6 mg/dL (ref 0.0–1.2)
CO2: 22 mmol/L (ref 20–29)
Calcium: 10.3 mg/dL — ABNORMAL HIGH (ref 8.6–10.2)
Chloride: 103 mmol/L (ref 96–106)
Creatinine, Ser: 0.96 mg/dL (ref 0.76–1.27)
GFR calc Af Amer: 93 mL/min/{1.73_m2} (ref >59)
GFR calc non Af Amer: 80 mL/min/{1.73_m2} (ref >59)
Globulin, Total: 1.7 g/dL (ref 1.5–4.5)
Glucose: 92 mg/dL (ref 65–99)
Potassium: 4.7 mmol/L (ref 3.5–5.2)
Sodium: 141 mmol/L (ref 134–144)
Total Protein: 6.8 g/dL (ref 6.0–8.5)

## 2019-06-19 LAB — LIPID PANEL W/O CHOL/HDL RATIO
Cholesterol, Total: 202 mg/dL — ABNORMAL HIGH (ref 100–199)
HDL: 85 mg/dL (ref >39)
LDL Chol Calc (NIH): 96 mg/dL (ref 0–99)
Triglycerides: 123 mg/dL (ref 0–149)
VLDL Cholesterol Cal: 21 mg/dL (ref 5–40)

## 2019-06-19 LAB — HEMOGLOBIN A1C
Est. average glucose Bld gHb Est-mCnc: 111 mg/dL
Hgb A1c MFr Bld: 5.5 % (ref 4.8–5.6)

## 2019-06-19 LAB — VITAMIN D 25 HYDROXY (VIT D DEFICIENCY, FRACTURES): Vit D, 25-Hydroxy: 38.9 ng/mL (ref 30.0–100.0)

## 2019-09-03 ENCOUNTER — Ambulatory Visit (INDEPENDENT_AMBULATORY_CARE_PROVIDER_SITE_OTHER): Payer: Medicare Other

## 2019-09-03 VITALS — BP 139/69 | HR 71 | Ht 69.5 in | Wt 206.0 lb

## 2019-09-03 DIAGNOSIS — Z0000 Encounter for general adult medical examination without abnormal findings: Secondary | ICD-10-CM

## 2019-09-03 NOTE — Patient Instructions (Signed)
Jim Bauer , Thank you for taking time to come for your Medicare Wellness Visit. I appreciate your ongoing commitment to your health goals. Please review the following plan we discussed and let me know if I can assist you in the future.   Screening recommendations/referrals: Colonoscopy: completed 05/07/2019 Recommended yearly ophthalmology/optometry visit for glaucoma screening and checkup Recommended yearly dental visit for hygiene and checkup  Vaccinations: Influenza vaccine: up to date Pneumococcal vaccine: up to date  Tdap vaccine: up to date Shingles vaccine: shingrix completed   Advanced directives:  Please bring a copy of your health care power of attorney and living will to the office at your convenience.  Conditions/risks identified: If you wish to quit smoking, help is available. For free tobacco cessation program offerings call the [facility] at [phone] or Live Well Line at ([phone]. You may also visit [email or web address] or email [email or web address] for more information on other programs.   Next appointment: Follow up in one year for your annual wellness visit   Preventive Care 65 Years and Older, Male Preventive care refers to lifestyle choices and visits with your health care provider that can promote health and wellness. What does preventive care include?  A yearly physical exam. This is also called an annual well check.  Dental exams once or twice a year.  Routine eye exams. Ask your health care provider how often you should have your eyes checked.  Personal lifestyle choices, including:  Daily care of your teeth and gums.  Regular physical activity.  Eating a healthy diet.  Avoiding tobacco and drug use.  Limiting alcohol use.  Practicing safe sex.  Taking low doses of aspirin every day.  Taking vitamin and mineral supplements as recommended by your health care provider. What happens during an annual well  check? The services and screenings done by your health care provider during your annual well check will depend on your age, overall health, lifestyle risk factors, and family history of disease. Counseling  Your health care provider may ask you questions about your:  Alcohol use.  Tobacco use.  Drug use.  Emotional well-being.  Home and relationship well-being.  Sexual activity.  Eating habits.  History of falls.  Memory and ability to understand (cognition).  Work and work Statistician. Screening  You may have the following tests or measurements:  Height, weight, and BMI.  Blood pressure.  Lipid and cholesterol levels. These may be checked every 5 years, or more frequently if you are over 8 years old.  Skin check.  Lung cancer screening. You may have this screening every year starting at age 59 if you have a 30-pack-year history of smoking and currently smoke or have quit within the past 15 years.  Fecal occult blood test (FOBT) of the stool. You may have this test every year starting at age 18.  Flexible sigmoidoscopy or colonoscopy. You may have a sigmoidoscopy every 5 years or a colonoscopy every 10 years starting at age 52.  Prostate cancer screening. Recommendations will vary depending on your family history and other risks.  Hepatitis C blood test.  Hepatitis B blood test.  Sexually transmitted disease (STD) testing.  Diabetes screening. This is done by checking your blood sugar (glucose) after you have not eaten for a while (fasting). You may have this done every 1-3 years.  Abdominal aortic aneurysm (AAA) screening. You may need this if you are a current or former smoker.  Osteoporosis. You may be screened starting at  age 29 if you are at high risk. Talk with your health care provider about your test results, treatment options, and if necessary, the need for more tests. Vaccines  Your health care provider may recommend certain vaccines, such  as:  Influenza vaccine. This is recommended every year.  Tetanus, diphtheria, and acellular pertussis (Tdap, Td) vaccine. You may need a Td booster every 10 years.  Zoster vaccine. You may need this after age 68.  Pneumococcal 13-valent conjugate (PCV13) vaccine. One dose is recommended after age 28.  Pneumococcal polysaccharide (PPSV23) vaccine. One dose is recommended after age 70. Talk to your health care provider about which screenings and vaccines you need and how often you need them. This information is not intended to replace advice given to you by your health care provider. Make sure you discuss any questions you have with your health care provider. Document Released: 10/01/2015 Document Revised: 05/24/2016 Document Reviewed: 07/06/2015 Elsevier Interactive Patient Education  2017 [place] Prevention in the Home Falls can cause injuries. They can happen to people of all ages. There are many things you can do to make your home safe and to help prevent falls. What can I do on the outside of my home?  Regularly fix the edges of walkways and driveways and fix any cracks.  Remove anything that might make you trip as you walk through a door, such as a raised step or threshold.  Trim any bushes or trees on the path to your home.  Use bright outdoor lighting.  Clear any walking paths of anything that might make someone trip, such as rocks or tools.  Regularly check to see if handrails are loose or broken. Make sure that both sides of any steps have handrails.  Any raised decks and porches should have guardrails on the edges.  Have any leaves, snow, or ice cleared regularly.  Use sand or salt on walking paths during winter.  Clean up any spills in your garage right away. This includes oil or grease spills. What can I do in the bathroom?  Use night lights.  Install grab bars by the toilet and in the tub and shower. Do not use towel bars as grab bars.  Use  non-skid mats or decals in the tub or shower.  If you need to sit down in the shower, use a plastic, non-slip stool.  Keep the floor dry. Clean up any water that spills on the floor as soon as it happens.  Remove soap buildup in the tub or shower regularly.  Attach bath mats securely with double-sided non-slip rug tape.  Do not have throw rugs and other things on the floor that can make you trip. What can I do in the bedroom?  Use night lights.  Make sure that you have a light by your bed that is easy to reach.  Do not use any sheets or blankets that are too big for your bed. They should not hang down onto the floor.  Have a firm chair that has side arms. You can use this for support while you get dressed.  Do not have throw rugs and other things on the floor that can make you trip. What can I do in the kitchen?  Clean up any spills right away.  Avoid walking on wet floors.  Keep items that you use a lot in easy-to-reach places.  If you need to reach something above you, use a strong step stool that has a grab bar.  Keep electrical cords out of the way.  Do not use floor polish or wax that makes floors slippery. If you must use wax, use non-skid floor wax.  Do not have throw rugs and other things on the floor that can make you trip. What can I do with my stairs?  Do not leave any items on the stairs.  Make sure that there are handrails on both sides of the stairs and use them. Fix handrails that are broken or loose. Make sure that handrails are as long as the stairways.  Check any carpeting to make sure that it is firmly attached to the stairs. Fix any carpet that is loose or worn.  Avoid having throw rugs at the top or bottom of the stairs. If you do have throw rugs, attach them to the floor with carpet tape.  Make sure that you have a light switch at the top of the stairs and the bottom of the stairs. If you do not have them, ask someone to add them for you. What  else can I do to help prevent falls?  Wear shoes that:  Do not have high heels.  Have rubber bottoms.  Are comfortable and fit you well.  Are closed at the toe. Do not wear sandals.  If you use a stepladder:  Make sure that it is fully opened. Do not climb a closed stepladder.  Make sure that both sides of the stepladder are locked into place.  Ask someone to hold it for you, if possible.  Clearly mark and make sure that you can see:  Any grab bars or handrails.  First and last steps.  Where the edge of each step is.  Use tools that help you move around (mobility aids) if they are needed. These include:  Canes.  Walkers.  Scooters.  Crutches.  Turn on the lights when you go into a dark area. Replace any light bulbs as soon as they burn out.  Set up your furniture so you have a clear path. Avoid moving your furniture around.  If any of your floors are uneven, fix them.  If there are any pets around you, be aware of where they are.  Review your medicines with your doctor. Some medicines can make you feel dizzy. This can increase your chance of falling. Ask your doctor what other things that you can do to help prevent falls. This information is not intended to replace advice given to you by your health care provider. Make sure you discuss any questions you have with your health care provider. Document Released: 07/01/2009 Document Revised: 02/10/2016 Document Reviewed: 10/09/2014 Elsevier Interactive Patient Education  2017 [employer].

## 2019-09-03 NOTE — Progress Notes (Signed)
Subjective:   Jim Bauer is a 68 y.o. male who presents for Medicare Annual/Subsequent preventive examination.  This visit is being conducted via phone call  - after an attmept to do on video chat - due to the COVID-19 pandemic. This patient has given me verbal consent via phone to conduct this visit, patient states they are participating from their home address. Some vital signs may be absent or patient reported.   Patient identification: identified by name, DOB, and current address.    Review of Systems:   Cardiac Risk Factors include: advanced age (>31men, >44 women);dyslipidemia;hypertension;male gender;smoking/ tobacco exposure     Objective:    Vitals: BP 139/69   Pulse 71   Ht 5' 9.5" (1.765 m)   Wt 206 lb (93.4 kg)   BMI 29.98 kg/m   Body mass index is 29.98 kg/m.  Advanced Directives 09/03/2019 05/07/2019  Does Patient Have a Medical Advance Directive? Yes Yes  Type of Advance Directive Living will;Healthcare Power of [place];Living will  Copy of Jim Bauer in Chart? No - copy requested No - copy requested    Tobacco Social History   Tobacco Use  Smoking Status Current Every Day Smoker  . Packs/day: 1.00  . Years: 43.00  . Pack years: 43.00  . Types: Cigarettes  Smokeless Tobacco Never Used     Ready to quit: No Counseling given: No    Clinical Intake:  Pre-visit preparation completed: Yes  Pain : No/denies pain     Nutritional Status: BMI 25 -29 Overweight Nutritional Risks: None Diabetes: No  How often do you need to have someone help you when you read instructions, pamphlets, or other written materials from your doctor or pharmacy?: 1 - Never  Interpreter Needed?: No  Information entered by :: Jim Brutus,LPN  Past Medical History:  Diagnosis Date  . Allergy 2015  . Essential hypertension   . Hyperlipidemia   . Personal history of tobacco use, presenting hazards to health 08/20/2015     Past Surgical History:  Procedure Laterality Date  . COLONOSCOPY    . COLONOSCOPY WITH PROPOFOL N/A 06/21/2015   Procedure: COLONOSCOPY WITH PROPOFOL;  Surgeon: Jim Sails, MD;  Location: [facility] ENDOSCOPY;  Service: Endoscopy;  Laterality: N/A;  . COLONOSCOPY WITH PROPOFOL N/A 05/07/2019   Procedure: COLONOSCOPY WITH PROPOFOL;  Surgeon: Jim Bellows, MD;  Location: [facility] ENDOSCOPY;  Service: Gastroenterology;  Laterality: N/A;  . KNEE ARTHROSCOPY    . TONSILLECTOMY     Family History  Problem Relation Age of Onset  . Macular degeneration Mother   . Diabetes Father   . Parkinson's disease Father   . Hypertension Father   . Obesity Brother   . Sleep apnea Brother   . Melanoma Daughter   . Diverticulitis Maternal Grandmother   . Alcohol abuse Maternal Grandfather   . Heart disease Paternal Grandmother   . Stroke Paternal Grandfather    Social History   Socioeconomic History  . Marital status: Widowed    Spouse name: Not on file  . Number of children: Not on file  . Years of education: Not on file  . Highest education level: Not on file  Occupational History  . Not on file  Tobacco Use  . Smoking status: Current Every Day Smoker    Packs/day: 1.00    Years: 43.00    Pack years: 43.00    Types: Cigarettes  . Smokeless tobacco: Never Used  Substance and Sexual Activity  .  Alcohol use: Yes    Alcohol/week: 14.0 standard drinks    Types: 14 Glasses of wine per week  . Drug use: Never  . Sexual activity: Not Currently    Birth control/protection: None  Other Topics Concern  . Not on file  Social History Narrative  . Not on file   Social Determinants of Health   Financial Resource Strain:   . Difficulty of Paying Living Expenses: Not on file  Food Insecurity:   . Worried About Charity fundraiser in the Last Year: Not on file  . Ran Out of Food in the Last Year: Not on file  Transportation Needs:   . Lack of Transportation (Medical): Not on file  . Lack of  Transportation (Non-Medical): Not on file  Physical Activity:   . Days of Exercise per Week: Not on file  . Minutes of Exercise per Session: Not on file  Stress:   . Feeling of Stress : Not on file  Social Connections:   . Frequency of Communication with Friends and Family: Not on file  . Frequency of Social Gatherings with Friends and Family: Not on file  . Attends Religious Services: Not on file  . Active Member of Clubs or Organizations: Not on file  . Attends Archivist Meetings: Not on file  . Marital Status: Not on file    Outpatient Encounter Medications as of 09/03/2019  Medication Sig  . aspirin EC 81 MG tablet Take 81 mg by mouth daily.  . Cholecalciferol (D2000 ULTRA STRENGTH) 50 MCG (2000 UT) CAPS Take 2,000 Units by mouth daily.  . Omega 3 1000 MG CAPS Take 1 capsule by mouth daily.  . simvastatin (ZOCOR) 80 MG tablet Take 1 tablet (80 mg total) by mouth daily.   No facility-administered encounter medications on file as of 09/03/2019.    Activities of Daily Living In your present state of health, do you have any difficulty performing the following activities: 09/03/2019 06/18/2019  Hearing? N N  Comment no hearing aids -  Vision? N N  Comment eyeglasses, dr.[facility]. -  Difficulty concentrating or making decisions? N N  Walking or climbing stairs? N N  Dressing or bathing? N N  Doing errands, shopping? N N  Preparing Food and eating ? N -  Using the Toilet? N -  In the past six months, have you accidently leaked urine? N -  Do you have problems with loss of bowel control? N -  Managing your Medications? N -  Managing your Finances? N -  Housekeeping or managing your Housekeeping? N -  Some recent data might be hidden    Patient Care Team: Jim Lick, NP as PCP - General (Nurse Practitioner)   Assessment:   This is a routine wellness examination for Jim Bauer.  Exercise Activities and Dietary recommendations Current Exercise Habits: Home  exercise routine(golf 2-4 weeks), Type of exercise: walking, Time (Minutes): 60, Frequency (Times/Week): 4, Weekly Exercise (Minutes/Week): 240, Intensity: Mild, Exercise limited by: None identified  Goals   None     Fall Risk: Fall Risk  09/03/2019 04/21/2019  Falls in the past year? 0 0  Number falls in past yr: 0 0  Injury with Fall? 0 0  Follow up - Falls evaluation completed    FALL RISK PREVENTION PERTAINING TO THE HOME:  Any stairs in or around the home? Yes, steps going in  If so, are there any without handrails? No   Home free of loose throw rugs  in walkways, pet beds, electrical cords, etc? Yes  Adequate lighting in your home to reduce risk of falls? Yes   ASSISTIVE DEVICES UTILIZED TO PREVENT FALLS:  Life alert? No  Use of a cane, walker or w/c? No  Grab bars in the bathroom? Yes  Shower chair or bench in shower? No  Elevated toilet seat or a handicapped toilet? No   TIMED UP AND GO:  Unable to perform   Depression Screen PHQ 2/9 Scores 09/03/2019 06/18/2019 04/21/2019  PHQ - 2 Score 0 0 2  PHQ- 9 Score - 1 2    Cognitive Function        Immunization History  Administered Date(s) Administered  . Fluad Quad(high Dose 65+) 06/18/2019  . Hepatitis A 10/30/2006, 05/16/2007  . IPV 05/01/2007  . Influenza Split 08/10/2014, 06/25/2015, 06/24/2018  . Influenza-Unspecified 06/25/2015, 08/26/2016, 06/15/2017  . Pneumococcal Conjugate-13 06/15/2017  . Pneumococcal Polysaccharide-23 08/08/2013, 06/18/2019  . Td 10/30/2006  . Tdap 12/10/2010  . Zoster Recombinat (Shingrix) 04/25/2018, 11/08/2018    Qualifies for Shingles Vaccine? Shingrix completed   Tdap: up to date   Flu Vaccine: up to date   Pneumococcal Vaccine: up to date   Screening Tests Health Maintenance  Topic Date Due  . Hepatitis C Screening  06/17/2020 (Originally Dec 08, 1950)  . TETANUS/TDAP  12/09/2020  . COLONOSCOPY  05/06/2022  . INFLUENZA VACCINE  Completed  . PNA vac Low Risk Adult   Completed   Cancer Screenings:  Colorectal Screening: Completed 05/07/2019. Repeat every 10 years  Lung Cancer Screening: (Low Dose CT Chest recommended if Age 61-80 years, 30 pack-year currently smoking OR have quit w/in 15years.) does qualify.   Completed 10/23/2018  Additional Screening:  Hepatitis C Screening: does qualify;   Vision Screening: Recommended annual ophthalmology exams for early detection of glaucoma and other disorders of the eye. Is the patient up to date with their annual eye exam?  No  Who is the provider or what is the name of the office in which the pt attends annual eye exams? [facility]    Dental Screening: Recommended annual dental exams for proper oral hygiene  Community Resource Referral:  CRR required this visit?  No        Plan:  I have personally reviewed and addressed the Medicare Annual Wellness questionnaire and have noted the following in the patient's chart:  A. Medical and social history B. Use of alcohol, tobacco or illicit drugs  C. Current medications and supplements D. Functional ability and status E.  Nutritional status F.  Physical activity G. Advance directives H. List of other physicians I.  Hospitalizations, surgeries, and ER visits in previous 12 months J.  [place] such as hearing and vision if needed, cognitive and depression L. Referrals and appointments   In addition, I have reviewed and discussed with patient certain preventive protocols, quality metrics, and best practice recommendations. A written personalized care plan for preventive services as well as general preventive health recommendations were provided to patient.   Signed,   Bevelyn Ngo, LPN  [id number] Nurse Health Advisor   Nurse Notes: none

## 2019-09-17 ENCOUNTER — Encounter: Payer: Self-pay | Admitting: Nurse Practitioner

## 2019-09-17 ENCOUNTER — Other Ambulatory Visit: Payer: Self-pay

## 2019-09-17 ENCOUNTER — Ambulatory Visit (INDEPENDENT_AMBULATORY_CARE_PROVIDER_SITE_OTHER): Payer: Medicare Other | Admitting: Nurse Practitioner

## 2019-09-17 VITALS — BP 138/74 | HR 70 | Temp 98.1°F | Ht 70.0 in | Wt 207.8 lb

## 2019-09-17 DIAGNOSIS — R7303 Prediabetes: Secondary | ICD-10-CM

## 2019-09-17 DIAGNOSIS — E782 Mixed hyperlipidemia: Secondary | ICD-10-CM | POA: Diagnosis not present

## 2019-09-17 DIAGNOSIS — F17210 Nicotine dependence, cigarettes, uncomplicated: Secondary | ICD-10-CM

## 2019-09-17 DIAGNOSIS — I10 Essential (primary) hypertension: Secondary | ICD-10-CM | POA: Diagnosis not present

## 2019-09-17 DIAGNOSIS — E559 Vitamin D deficiency, unspecified: Secondary | ICD-10-CM

## 2019-09-17 DIAGNOSIS — I77811 Abdominal aortic ectasia: Secondary | ICD-10-CM

## 2019-09-17 DIAGNOSIS — N400 Enlarged prostate without lower urinary tract symptoms: Secondary | ICD-10-CM

## 2019-09-17 DIAGNOSIS — I700 Atherosclerosis of aorta: Secondary | ICD-10-CM

## 2019-09-17 NOTE — Patient Instructions (Signed)

## 2019-09-17 NOTE — Assessment & Plan Note (Signed)
Continue supplement and recheck Vit D level annually.

## 2019-09-17 NOTE — Assessment & Plan Note (Signed)
Improved on recent testing with A1C 5.5%.  Praised for success with diet.

## 2019-09-17 NOTE — Assessment & Plan Note (Addendum)
Chronic, ongoing with elevation in BP today initially, repeat improved, but home readings significantly below goal at baseline.  Will continue focus on DASH diet at home and recommend complete smoking cessation.  Initiate medication as needed if home readings elevated.  He documents BP consistently, bringing flow sheets to visits. Return in 6 months.  Check TSH today.

## 2019-09-17 NOTE — Assessment & Plan Note (Signed)
Chronic, ongoing.  Continue current medication regimen.  Check lipid panel and CMP next visit.

## 2019-09-17 NOTE — Assessment & Plan Note (Signed)
Recommend complete cessation of smoking.  Continue daily statin and ASA.

## 2019-09-17 NOTE — Assessment & Plan Note (Addendum)
Continue to monitor BP and ensure good control.  Recommend continue ASA and statin daily, adjust as needed + complete cessation smoking.  Repeat scan December 2021. 

## 2019-09-17 NOTE — Progress Notes (Signed)
BP 138/74 (BP Location: Left Arm, Patient Position: Sitting)   Pulse 70   Temp 98.1 F (36.7 C) (Oral)   Ht 5\' 10"  (1.778 m)   Wt 207 lb 12.8 oz (94.3 kg)   SpO2 95%   BMI 29.82 kg/m    Subjective:    Patient ID: Jim Bauer, male    DOB: 03-12-1951, 68 y.o.   MRN: [id number]  HPI: Kieffer Adi is a 68 y.o. male presenting on 09/17/2019 for comprehensive medical examination. Current medical complaints include:none  He currently lives with: wife Interim Problems from his last visit: no    HYPERTENSION / HYPERLIPIDEMIA Continues on Simvastatin 80 MG and ASA. Currently continues to smoke 1 PPD, does attend yearly CT scans for screening.  Last LDL in September = 96 and TCHOL 202.   Did have AAA screening in December 2016 noting "Abdominal aortic ectasia 2.8 cm. Ectatic abdominal aorta at risk for aneurysm development. Recommend followup by ultrasound in 5 years".   Satisfied with current treatment? yes Duration of hypertension: chronic BP monitoring frequency: daily BP range: 99/50 to 139/69 with average HR 50 to 70 BP medication side effects: no Duration of hyperlipidemia: chronic Cholesterol medication side effects: no, Cholesterol supplements: fish oil Medication compliance: good compliance Aspirin: yes Recent stressors: no Recurrent headaches: no Visual changes: no Palpitations: no Dyspnea: no Chest pain: no Lower extremity edema: no Dizzy/lightheaded: no   VITAMIN D DEFICIENCY: Continues on daily supplement.  No recent falls or fractures.  Recent Vitamin D level 38.9 and CA+ 10.3.    PREDIABETES: A1C range 5.7 to 5.9, with recent in September improved at 5.5.  No current medications, has been focused on diet. Polydipsia/polyuria: no Visual disturbance: no Chest pain: no Paresthesias: no  Functional Status Survey: Is the patient deaf or have difficulty hearing?: No Does the patient have difficulty seeing, even when wearing glasses/contacts?: No Does  the patient have difficulty concentrating, remembering, or making decisions?: No Does the patient have difficulty walking or climbing stairs?: No Does the patient have difficulty dressing or bathing?: No Does the patient have difficulty doing errands alone such as visiting a doctor's office or shopping?: No  FALL RISK: Fall Risk  09/17/2019 09/03/2019 04/21/2019  Falls in the past year? 1 0 0  Number falls in past yr: 0 0 0  Injury with Fall? 1 0 0  Follow up - - Falls evaluation completed    Depression Screen Depression screen [facility] 2/9 09/17/2019 09/17/2019 09/03/2019 06/18/2019 04/21/2019  Decreased Interest 0 0 0 0 1  Down, Depressed, Hopeless 0 0 0 0 1  PHQ - 2 Score 0 0 0 0 2  Altered sleeping 0 - - 0 0  Tired, decreased energy 0 - - 1 0  Change in appetite 0 - - 0 0  Feeling bad or failure about yourself  0 - - 0 0  Trouble concentrating 0 - - 0 0  Moving slowly or fidgety/restless 0 - - 0 0  Suicidal thoughts 0 - - 0 0  PHQ-9 Score 0 - - 1 2  Difficult doing work/chores - - - Not difficult at all Not difficult at all    Advanced Directives <no information>  Past Medical History:  Past Medical History:  Diagnosis Date  . Allergy 2015  . Essential hypertension   . Hyperlipidemia   . Personal history of tobacco use, presenting hazards to health 08/20/2015    Surgical History:  Past Surgical History:  Procedure Laterality Date  .  COLONOSCOPY    . COLONOSCOPY WITH PROPOFOL N/A 06/21/2015   Procedure: COLONOSCOPY WITH PROPOFOL;  Surgeon: Lollie Sails, MD;  Location: [facility] ENDOSCOPY;  Service: Endoscopy;  Laterality: N/A;  . COLONOSCOPY WITH PROPOFOL N/A 05/07/2019   Procedure: COLONOSCOPY WITH PROPOFOL;  Surgeon: Jonathon Bellows, MD;  Location: [facility] ENDOSCOPY;  Service: Gastroenterology;  Laterality: N/A;  . KNEE ARTHROSCOPY    . TONSILLECTOMY      Medications:  Current Outpatient Medications on File Prior to Visit  Medication Sig  . aspirin EC 81 MG tablet Take 81 mg  by mouth daily.  . Cholecalciferol (D2000 ULTRA STRENGTH) 50 MCG (2000 UT) CAPS Take 2,000 Units by mouth daily.  . Omega 3 1000 MG CAPS Take 1 capsule by mouth daily.  . simvastatin (ZOCOR) 80 MG tablet Take 1 tablet (80 mg total) by mouth daily.   No current facility-administered medications on file prior to visit.    Allergies:  Allergies  Allergen Reactions  . Bee Venom Swelling    Social History:  Social History   Socioeconomic History  . Marital status: Widowed    Spouse name: Not on file  . Number of children: Not on file  . Years of education: Not on file  . Highest education level: Not on file  Occupational History  . Not on file  Tobacco Use  . Smoking status: Current Every Day Smoker    Packs/day: 1.00    Years: 43.00    Pack years: 43.00    Types: Cigarettes  . Smokeless tobacco: Never Used  Substance and Sexual Activity  . Alcohol use: Yes    Alcohol/week: 14.0 standard drinks    Types: 14 Glasses of wine per week  . Drug use: Never  . Sexual activity: Not Currently    Birth control/protection: None  Other Topics Concern  . Not on file  Social History Narrative  . Not on file   Social Determinants of Health   Financial Resource Strain:   . Difficulty of Paying Living Expenses: Not on file  Food Insecurity:   . Worried About Charity fundraiser in the Last Year: Not on file  . Ran Out of Food in the Last Year: Not on file  Transportation Needs:   . Lack of Transportation (Medical): Not on file  . Lack of Transportation (Non-Medical): Not on file  Physical Activity:   . Days of Exercise per Week: Not on file  . Minutes of Exercise per Session: Not on file  Stress:   . Feeling of Stress : Not on file  Social Connections:   . Frequency of Communication with Friends and Family: Not on file  . Frequency of Social Gatherings with Friends and Family: Not on file  . Attends Religious Services: Not on file  . Active Member of Clubs or Organizations:  Not on file  . Attends Archivist Meetings: Not on file  . Marital Status: Not on file  Intimate Partner Violence:   . Fear of Current or Ex-Partner: Not on file  . Emotionally Abused: Not on file  . Physically Abused: Not on file  . Sexually Abused: Not on file   Social History   Tobacco Use  Smoking Status Current Every Day Smoker  . Packs/day: 1.00  . Years: 43.00  . Pack years: 43.00  . Types: Cigarettes  Smokeless Tobacco Never Used   Social History   Substance and Sexual Activity  Alcohol Use Yes  . Alcohol/week: 14.0 standard drinks  .  Types: 14 Glasses of wine per week    Family History:  Family History  Problem Relation Age of Onset  . Macular degeneration Mother   . Diabetes Father   . Parkinson's disease Father   . Hypertension Father   . Obesity Brother   . Sleep apnea Brother   . Melanoma Daughter   . Diverticulitis Maternal Grandmother   . Alcohol abuse Maternal Grandfather   . Heart disease Paternal Grandmother   . Stroke Paternal Grandfather     Past medical history, surgical history, medications, allergies, family history and social history reviewed with patient today and changes made to appropriate areas of the chart.   Review of Systems - negative All other ROS negative except what is listed above and in the HPI.      Objective:    BP 138/74 (BP Location: Left Arm, Patient Position: Sitting)   Pulse 70   Temp 98.1 F (36.7 C) (Oral)   Ht 5\' 10"  (1.778 m)   Wt 207 lb 12.8 oz (94.3 kg)   SpO2 95%   BMI 29.82 kg/m   Wt Readings from Last 3 Encounters:  09/17/19 207 lb 12.8 oz (94.3 kg)  09/03/19 206 lb (93.4 kg)  06/18/19 203 lb (92.1 kg)    Physical Exam Vitals and nursing note reviewed.  Constitutional:      General: He is awake. He is not in acute distress.    Appearance: He is well-developed and overweight. He is not ill-appearing.  HENT:     Head: Normocephalic and atraumatic.     Right Ear: Hearing, tympanic  membrane, ear canal and external ear normal. No drainage.     Left Ear: Hearing, tympanic membrane, ear canal and external ear normal. No drainage.  Eyes:     General: Lids are normal.        Right eye: No discharge.        Left eye: No discharge.     Extraocular Movements: Extraocular movements intact.     Conjunctiva/sclera: Conjunctivae normal.     Pupils: Pupils are equal, round, and reactive to light.     Visual Fields: Right eye visual fields normal and left eye visual fields normal.  Neck:     Thyroid: No thyromegaly.     Vascular: No carotid bruit or JVD.  Cardiovascular:     Rate and Rhythm: Normal rate and regular rhythm.     Heart sounds: Normal heart sounds, S1 normal and S2 normal. No murmur. No gallop.   Pulmonary:     Effort: Pulmonary effort is normal. No accessory muscle usage or respiratory distress.     Breath sounds: Normal breath sounds.  Abdominal:     General: Bowel sounds are normal.     Palpations: Abdomen is soft. There is no hepatomegaly.     Tenderness: There is no abdominal tenderness.  Genitourinary:    Comments: Performed at last visit. Musculoskeletal:        General: Normal range of motion.     Cervical back: Normal range of motion and neck supple.     Right lower leg: No edema.     Left lower leg: No edema.  Lymphadenopathy:     Head:     Right side of head: No submental, submandibular, tonsillar, preauricular or posterior auricular adenopathy.     Left side of head: No submental, submandibular, tonsillar, preauricular or posterior auricular adenopathy.     Cervical: No cervical adenopathy.  Skin:    General:  Skin is warm and dry.     Capillary Refill: Capillary refill takes less than 2 seconds.     Findings: No rash.       Neurological:     Mental Status: He is alert and oriented to person, place, and time.     Cranial Nerves: Cranial nerves are intact.     Gait: Gait is intact.     Deep Tendon Reflexes: Reflexes are normal and  symmetric.     Reflex Scores:      Brachioradialis reflexes are 2+ on the right side and 2+ on the left side.      Patellar reflexes are 2+ on the right side and 2+ on the left side. Psychiatric:        Attention and Perception: Attention normal.        Mood and Affect: Mood normal.        Speech: Speech normal.        Behavior: Behavior normal. Behavior is cooperative.        Thought Content: Thought content normal.        Judgment: Judgment normal.    6CIT Screen 09/17/2019  What Year? 0 points  What month? 0 points  What time? 0 points  Count back from 20 0 points  Months in reverse 0 points  Repeat phrase 0 points  Total Score 0   Results for orders placed or performed in visit on 06/18/19  Comprehensive metabolic panel  Result Value Ref Range   Glucose 92 65 - 99 mg/dL   BUN 24 8 - 27 mg/dL   Creatinine, Ser 0.96 0.76 - 1.27 mg/dL   GFR calc non Af Amer 80 >59 mL/min/1.73   GFR calc Af Amer 93 >59 mL/min/1.73   BUN/Creatinine Ratio 25 (H) 10 - 24   Sodium 141 134 - 144 mmol/L   Potassium 4.7 3.5 - 5.2 mmol/L   Chloride 103 96 - 106 mmol/L   CO2 22 20 - 29 mmol/L   Calcium 10.3 (H) 8.6 - 10.2 mg/dL   Total Protein 6.8 6.0 - 8.5 g/dL   Albumin 5.1 (H) 3.8 - 4.8 g/dL   Globulin, Total 1.7 1.5 - 4.5 g/dL   Albumin/Globulin Ratio 3.0 (H) 1.2 - 2.2   Bilirubin Total 0.6 0.0 - 1.2 mg/dL   Alkaline Phosphatase 98 39 - 117 IU/L   AST 24 0 - 40 IU/L   ALT 28 0 - 44 IU/L  CBC with Differential/Platelet  Result Value Ref Range   WBC 6.1 3.4 - 10.8 x10E3/uL   RBC 5.61 4.14 - 5.80 x10E6/uL   Hemoglobin 16.7 13.0 - 17.7 g/dL   Hematocrit 49.1 37.5 - 51.0 %   MCV 88 79 - 97 fL   MCH 29.8 26.6 - 33.0 pg   MCHC 34.0 31.5 - 35.7 g/dL   RDW 13.7 11.6 - 15.4 %   Platelets 219 150 - 450 x10E3/uL   Neutrophils 49 Not Estab. %   Lymphs 39 Not Estab. %   Monocytes 11 Not Estab. %   Eos 1 Not Estab. %   Basos 0 Not Estab. %   Neutrophils Absolute 2.9 1.4 - 7.0 x10E3/uL    Lymphocytes Absolute 2.4 0.7 - 3.1 x10E3/uL   Monocytes Absolute 0.7 0.1 - 0.9 x10E3/uL   EOS (ABSOLUTE) 0.1 0.0 - 0.4 x10E3/uL   Basophils Absolute 0.0 0.0 - 0.2 x10E3/uL   Immature Granulocytes 0 Not Estab. %   Immature Grans (Abs) 0.0 0.0 - 0.1  x10E3/uL  Lipid Panel w/o Chol/HDL Ratio  Result Value Ref Range   Cholesterol, Total 202 (H) 100 - 199 mg/dL   Triglycerides 123 0 - 149 mg/dL   HDL 85 >39 mg/dL   VLDL Cholesterol Cal 21 5 - 40 mg/dL   LDL Chol Calc (NIH) 96 0 - 99 mg/dL  HgB A1c  Result Value Ref Range   Hgb A1c MFr Bld 5.5 4.8 - 5.6 %   Est. average glucose Bld gHb Est-mCnc 111 mg/dL  Vitamin D (25 hydroxy)  Result Value Ref Range   Vit D, 25-Hydroxy 38.9 30.0 - 100.0 ng/mL      Assessment & Plan:   Problem List Items Addressed This Visit      Cardiovascular and Mediastinum   Essential hypertension    Chronic, ongoing with elevation in BP today initially, repeat improved, but home readings significantly below goal at baseline.  Will continue focus on DASH diet at home and recommend complete smoking cessation.  Initiate medication as needed if home readings elevated.  He documents BP consistently, bringing flow sheets to visits. Return in 6 months.  Check TSH today.      Relevant Orders   TSH   Aortic atherosclerosis ([place]) - Primary    Recommend complete cessation of smoking.  Continue daily statin and ASA.      Aortic ectasia, abdominal ([facility])    Continue to monitor BP and ensure good control.  Recommend continue ASA and statin daily, adjust as needed + complete cessation smoking.  Repeat scan December 2021.      Relevant Orders   TSH     Other   Prediabetes    Improved on recent testing with A1C 5.5%.  Praised for success with diet.      Relevant Orders   TSH   Vitamin D deficiency    Continue supplement and recheck Vit D level annually.        Hyperlipidemia    Chronic, ongoing.  Continue current medication regimen.  Check lipid panel and CMP  next visit.      Nicotine dependence, cigarettes, uncomplicated    I have recommended complete cessation of tobacco use. I have discussed various options available for assistance with tobacco cessation including over the counter methods (Nicotine gum, patch and lozenges). We also discussed prescription options (Chantix, Nicotine Inhaler / Nasal Spray). The patient is not interested in pursuing any prescription tobacco cessation options at this time.        Other Visit Diagnoses    Enlarged prostate       History of on exams, check PSA today.   Relevant Orders   PSA       Discussed aspirin prophylaxis for myocardial infarction prevention and decision was made to continue ASA  LABORATORY TESTING:  Health maintenance labs ordered today as discussed above.   The natural history of prostate cancer and ongoing controversy regarding screening and potential treatment outcomes of prostate cancer has been discussed with the patient. The meaning of a false positive PSA and a false negative PSA has been discussed. He indicates understanding of the limitations of this screening test and wishes to proceed with screening PSA testing.   IMMUNIZATIONS:   - Tdap: Tetanus vaccination status reviewed: last tetanus booster within 10 years. - Influenza: Up to date - Pneumovax: Up to date - Prevnar: Up to date - Zostavax vaccine: Up to date  SCREENING: - Colonoscopy: Up to date , performed 05/07/2019 with 2.8 cm measurement, recommend follow-up  in 5 years Discussed with patient purpose of the colonoscopy is to detect colon cancer at curable precancerous or early stages   - AAA Screening: Up to date , performed on 08/20/2015 -Hearing Test: Not applicable  -Spirometry: Not applicable   PATIENT COUNSELING:    Sexuality: Discussed sexually transmitted diseases, partner selection, use of condoms, avoidance of unintended pregnancy  and contraceptive alternatives.   Advised to avoid cigarette  smoking.  I discussed with the patient that most people either abstain from alcohol or drink within safe limits (<=14/week and <=4 drinks/occasion for males, <=7/weeks and <= 3 drinks/occasion for females) and that the risk for alcohol disorders and other health effects rises proportionally with the number of drinks per week and how often a drinker exceeds daily limits.  Discussed cessation/primary prevention of drug use and availability of treatment for abuse.   Diet: Encouraged to adjust caloric intake to maintain  or achieve ideal body weight, to reduce intake of dietary saturated fat and total fat, to limit sodium intake by avoiding high sodium foods and not adding table salt, and to maintain adequate dietary potassium and calcium preferably from fresh fruits, vegetables, and low-fat dairy products.    stressed the importance of regular exercise  Injury prevention: Discussed safety belts, safety helmets, smoke detector, smoking near bedding or upholstery.   Dental health: Discussed importance of regular tooth brushing, flossing, and dental visits.   Follow up plan: NEXT PREVENTATIVE PHYSICAL DUE IN 1 YEAR. Return in about 6 months (around 03/17/2020) for HTN/HLD, AAA, Smoker.

## 2019-09-17 NOTE — Assessment & Plan Note (Signed)
I have recommended complete cessation of tobacco use. I have discussed various options available for assistance with tobacco cessation including over the counter methods (Nicotine gum, patch and lozenges). We also discussed prescription options (Chantix, Nicotine Inhaler / Nasal Spray). The patient is not interested in pursuing any prescription tobacco cessation options at this time.  

## 2019-09-18 LAB — PSA: Prostate Specific Ag, Serum: 3.5 ng/mL (ref 0.0–4.0)

## 2019-09-18 LAB — TSH: TSH: 1.15 u[IU]/mL (ref 0.450–4.500)

## 2019-10-14 DIAGNOSIS — H2513 Age-related nuclear cataract, bilateral: Secondary | ICD-10-CM | POA: Diagnosis not present

## 2019-10-16 ENCOUNTER — Telehealth: Payer: Self-pay | Admitting: *Deleted

## 2019-10-16 DIAGNOSIS — Z87891 Personal history of nicotine dependence: Secondary | ICD-10-CM

## 2019-10-16 NOTE — Telephone Encounter (Signed)
Patient has been notified that annual lung cancer screening low dose CT scan is due currently or will be in near future. Confirmed that patient is within the age range of 55-77, and asymptomatic, (no signs or symptoms of lung cancer). Patient denies illness that would prevent curative treatment for lung cancer if found. Verified smoking history, (current, 44 pack year). The shared decision making visit was done 08/20/15. Patient is agreeable for CT scan being scheduled.

## 2019-10-24 ENCOUNTER — Other Ambulatory Visit: Payer: Self-pay

## 2019-10-24 ENCOUNTER — Ambulatory Visit
Admission: RE | Admit: 2019-10-24 | Discharge: 2019-10-24 | Disposition: A | Discharge status: Home / Self Care | Payer: PPO | Source: Ambulatory Visit | Attending: Nurse Practitioner | Admitting: Nurse Practitioner

## 2019-10-24 DIAGNOSIS — F17210 Nicotine dependence, cigarettes, uncomplicated: Secondary | ICD-10-CM | POA: Diagnosis not present

## 2019-10-24 DIAGNOSIS — Z87891 Personal history of nicotine dependence: Secondary | ICD-10-CM | POA: Diagnosis not present

## 2019-10-28 ENCOUNTER — Encounter: Payer: Self-pay | Admitting: Nurse Practitioner

## 2019-10-28 ENCOUNTER — Encounter: Payer: Self-pay | Admitting: *Deleted

## 2019-10-28 DIAGNOSIS — J432 Centrilobular emphysema: Secondary | ICD-10-CM

## 2019-10-28 HISTORY — DX: Centrilobular emphysema: J43.2

## 2020-01-21 DIAGNOSIS — L570 Actinic keratosis: Secondary | ICD-10-CM | POA: Diagnosis not present

## 2020-01-21 DIAGNOSIS — Z86018 Personal history of other benign neoplasm: Secondary | ICD-10-CM | POA: Diagnosis not present

## 2020-01-21 DIAGNOSIS — L578 Other skin changes due to chronic exposure to nonionizing radiation: Secondary | ICD-10-CM | POA: Diagnosis not present

## 2020-01-21 DIAGNOSIS — Z872 Personal history of diseases of the skin and subcutaneous tissue: Secondary | ICD-10-CM | POA: Diagnosis not present

## 2020-01-21 DIAGNOSIS — L821 Other seborrheic keratosis: Secondary | ICD-10-CM | POA: Diagnosis not present

## 2020-03-17 ENCOUNTER — Encounter: Payer: Self-pay | Admitting: Nurse Practitioner

## 2020-03-17 ENCOUNTER — Other Ambulatory Visit: Payer: Self-pay

## 2020-03-17 ENCOUNTER — Ambulatory Visit (INDEPENDENT_AMBULATORY_CARE_PROVIDER_SITE_OTHER): Payer: PPO | Admitting: Nurse Practitioner

## 2020-03-17 VITALS — BP 118/70 | HR 65 | Temp 98.3°F | Wt 206.6 lb

## 2020-03-17 DIAGNOSIS — I77811 Abdominal aortic ectasia: Secondary | ICD-10-CM

## 2020-03-17 DIAGNOSIS — I700 Atherosclerosis of aorta: Secondary | ICD-10-CM | POA: Diagnosis not present

## 2020-03-17 DIAGNOSIS — R918 Other nonspecific abnormal finding of lung field: Secondary | ICD-10-CM | POA: Diagnosis not present

## 2020-03-17 DIAGNOSIS — J432 Centrilobular emphysema: Secondary | ICD-10-CM

## 2020-03-17 DIAGNOSIS — E782 Mixed hyperlipidemia: Secondary | ICD-10-CM | POA: Diagnosis not present

## 2020-03-17 DIAGNOSIS — E559 Vitamin D deficiency, unspecified: Secondary | ICD-10-CM | POA: Diagnosis not present

## 2020-03-17 DIAGNOSIS — I10 Essential (primary) hypertension: Secondary | ICD-10-CM | POA: Diagnosis not present

## 2020-03-17 DIAGNOSIS — F17210 Nicotine dependence, cigarettes, uncomplicated: Secondary | ICD-10-CM

## 2020-03-17 MED ORDER — SIMVASTATIN 80 MG PO TABS: 80.0000 mg | ORAL_TABLET | Freq: Every day | ORAL | 4 refills | Status: DC

## 2020-03-17 NOTE — Assessment & Plan Note (Signed)
Noted on February 2021 lung screening.  Will plan on PFT next visit.  Currently no symptoms and no inhaler regimen.  Will plan to initiate inhaler regimen as needed and recommend complete cessation of smoking.  Continue yearly lung screening.

## 2020-03-17 NOTE — Assessment & Plan Note (Signed)
Chronic, ongoing with elevation in BP today initially, repeat improved, but home readings significantly below goal at baseline.  Will continue focus on DASH diet at home and recommend complete smoking cessation.  Initiate medication as needed if home readings elevated.  He documents BP consistently, bringing flow sheets to visits. Return in 6 months.  Check BMP today.

## 2020-03-17 NOTE — Assessment & Plan Note (Signed)
I have recommended complete cessation of tobacco use. I have discussed various options available for assistance with tobacco cessation including over the counter methods (Nicotine gum, patch and lozenges). We also discussed prescription options (Chantix, Nicotine Inhaler / Nasal Spray). The patient is not interested in pursuing any prescription tobacco cessation options at this time.  

## 2020-03-17 NOTE — Assessment & Plan Note (Signed)
Followed yearly by nodule clinic.  Continue to monitor.  Recommend complete smoking cessation. 

## 2020-03-17 NOTE — Patient Instructions (Signed)
Chronic Obstructive Pulmonary Disease Chronic obstructive pulmonary disease (COPD) is a long-term (chronic) lung problem. When you have COPD, it is hard for air to get in and out of your lungs. Usually the condition gets worse over time, and your lungs will never return to normal. There are things you can do to keep yourself as healthy as possible.  Your doctor may treat your condition with: ? Medicines. ? Oxygen. ? Lung surgery.  Your doctor may also recommend: ? Rehabilitation. This includes steps to make your body work better. It may involve a team of specialists. ? Quitting smoking, if you smoke. ? Exercise and changes to your diet. ? Comfort measures (palliative care). Follow these instructions at home: Medicines  Take over-the-counter and prescription medicines only as told by your doctor.  Talk to your doctor before taking any cough or allergy medicines. You may need to avoid medicines that cause your lungs to be dry. Lifestyle  If you smoke, stop. Smoking makes the problem worse. If you need help quitting, ask your doctor.  Avoid being around things that make your breathing worse. This may include smoke, chemicals, and fumes.  Stay active, but remember to rest as well.  Learn and use tips on how to relax.  Make sure you get enough sleep. Most adults need at least 7 hours of sleep every night.  Eat healthy foods. Eat smaller meals more often. Rest before meals. Controlled breathing Learn and use tips on how to control your breathing as told by your doctor. Try:  Breathing in (inhaling) through your nose for 1 second. Then, pucker your lips and breath out (exhale) through your lips for 2 seconds.  Putting one hand on your belly (abdomen). Breathe in slowly through your nose for 1 second. Your hand on your belly should move out. Pucker your lips and breathe out slowly through your lips. Your hand on your belly should move in as you breathe out.  Controlled coughing Learn  and use controlled coughing to clear mucus from your lungs. Follow these steps: 1. Lean your head a little forward. 2. Breathe in deeply. 3. Try to hold your breath for 3 seconds. 4. Keep your mouth slightly open while coughing 2 times. 5. Spit any mucus out into a tissue. 6. Rest and do the steps again 1 or 2 times as needed. General instructions  Make sure you get all the shots (vaccines) that your doctor recommends. Ask your doctor about a flu shot and a pneumonia shot.  Use oxygen therapy and pulmonary rehabilitation if told by your doctor. If you need home oxygen therapy, ask your doctor if you should buy a tool to measure your oxygen level (oximeter).  Make a COPD action plan with your doctor. This helps you to know what to do if you feel worse than usual.  Manage any other conditions you have as told by your doctor.  Avoid going outside when it is very hot, cold, or humid.  Avoid people who have a sickness you can catch (contagious).  Keep all follow-up visits as told by your doctor. This is important. Contact a doctor if:  You cough up more mucus than usual.  There is a change in the color or thickness of the mucus.  It is harder to breathe than usual.  Your breathing is faster than usual.  You have trouble sleeping.  You need to use your medicines more often than usual.  You have trouble doing your normal activities such as getting dressed   or walking around the house. Get help right away if:  You have shortness of breath while resting.  You have shortness of breath that stops you from: ? Being able to talk. ? Doing normal activities.  Your chest hurts for longer than 5 minutes.  Your skin color is more blue than usual.  Your pulse oximeter shows that you have low oxygen for longer than 5 minutes.  You have a fever.  You feel too tired to breathe normally. Summary  Chronic obstructive pulmonary disease (COPD) is a long-term lung problem.  The way your  lungs work will never return to normal. Usually the condition gets worse over time. There are things you can do to keep yourself as healthy as possible.  Take over-the-counter and prescription medicines only as told by your doctor.  If you smoke, stop. Smoking makes the problem worse. This information is not intended to replace advice given to you by your health care provider. Make sure you discuss any questions you have with your health care provider. Document Revised: 08/17/2017 Document Reviewed: 10/09/2016 Elsevier Patient Education  2020 [employer]  

## 2020-03-17 NOTE — Assessment & Plan Note (Addendum)
Ongoing.  Continue supplement and recheck Vit D level today.  Adjust supplement as needed.

## 2020-03-17 NOTE — Assessment & Plan Note (Signed)
Noted on Lung CT screening.  Recommend complete cessation of smoking.  Continue daily statin and ASA for prevention. 

## 2020-03-17 NOTE — Assessment & Plan Note (Signed)
Chronic, ongoing.  Continue current medication regimen and adjust as needed.  Check lipid panel today, last LDL >70, may adjust statin dose if ongoing elevation.  He is not fasting today.

## 2020-03-17 NOTE — Assessment & Plan Note (Signed)
Continue to monitor BP and ensure good control.  Recommend continue ASA and statin daily, adjust as needed + complete cessation smoking.  Repeat scan December 2021.

## 2020-03-17 NOTE — Progress Notes (Signed)
BP 118/70 (BP Location: Left Arm)   Pulse 65   Temp 98.3 F (36.8 C) (Oral)   Wt 206 lb 9.6 oz (93.7 kg)   SpO2 98%   BMI 29.64 kg/m    Subjective:    Patient ID: Jim Bauer, male    DOB: September 19, 1950, 69 y.o.   MRN: [id number]  HPI: Jim Bauer is a 69 y.o. male  Chief Complaint  Patient presents with  . Hyperlipidemia  . Hypertension   HYPERTENSION / HYPERLIPIDEMIA Continues on Simvastatin 80 MG and ASA, no current BP medications. Last LDL 96 and TCHOL 202.   Did have AAA screening in December 2016 noting "Abdominal aortic ectasia 2.8 cm. Ectatic abdominal aorta at risk for aneurysm development. Recommend follow-up by ultrasound in 5 years".  To repeat in December 2021. Satisfied with current treatment?yes Duration of hypertension:chronic BP monitoring frequency:daily BP range:108/62 to 149/72 -- on average at goal <130/80 BP medication side effects:no Duration of hyperlipidemia:chronic Cholesterol medication side effects:no, Cholesterol supplements: fish oil Medication compliance:good compliance Aspirin:yes Recent stressors:no Recurrent headaches:no Visual changes:no Palpitations:no Dyspnea:no Chest pain:no Lower extremity edema:no Dizzy/lightheaded:no  VITAMIN D DEFICIENCY: Continues on daily supplement. No recent falls or fractures. Recent Vitamin D level 38.9 and CA+ 10.3.    COPD CT lung screening noting mild centrilobular emphysema and aortic atherosclerosis on recent scan 10/24/19.  Smoking a little less then 1 PPD.  COPD status: stable Satisfied with current treatment?: yes Oxygen use: no Dyspnea frequency: none Cough frequency: occasional Rescue inhaler frequency:  none Limitation of activity: no Productive cough: none Last Spirometry: unknown Pneumovax: Up to Date Influenza: Up to Date   Relevant past medical, surgical, family and social history reviewed and updated as indicated. Interim medical history since our  last visit reviewed. Allergies and medications reviewed and updated.  Review of Systems  Constitutional: Negative for activity change, diaphoresis, fatigue and fever.  Respiratory: Negative for cough, chest tightness, shortness of breath and wheezing.   Cardiovascular: Negative for chest pain, palpitations and leg swelling.  Gastrointestinal: Negative.   Neurological: Negative.   Psychiatric/Behavioral: Negative.     Per HPI unless specifically indicated above     Objective:    BP 118/70 (BP Location: Left Arm)   Pulse 65   Temp 98.3 F (36.8 C) (Oral)   Wt 206 lb 9.6 oz (93.7 kg)   SpO2 98%   BMI 29.64 kg/m   Wt Readings from Last 3 Encounters:  03/17/20 206 lb 9.6 oz (93.7 kg)  10/24/19 207 lb (93.9 kg)  09/17/19 207 lb 12.8 oz (94.3 kg)    Physical Exam Vitals and nursing note reviewed.  Constitutional:      General: He is awake. He is not in acute distress.    Appearance: He is well-developed, well-groomed and overweight. He is not ill-appearing.  HENT:     Head: Normocephalic and atraumatic.     Right Ear: Hearing normal. No drainage.     Left Ear: Hearing normal. No drainage.  Eyes:     General: Lids are normal.        Right eye: No discharge.        Left eye: No discharge.     Conjunctiva/sclera: Conjunctivae normal.     Pupils: Pupils are equal, round, and reactive to light.  Neck:     Thyroid: No thyromegaly.     Vascular: No carotid bruit.     Trachea: Trachea normal.  Cardiovascular:     Rate and Rhythm: Normal  rate and regular rhythm.     Heart sounds: Normal heart sounds, S1 normal and S2 normal. No murmur heard.  No gallop.   Pulmonary:     Effort: Pulmonary effort is normal. No accessory muscle usage or respiratory distress.     Breath sounds: Normal breath sounds.  Abdominal:     General: Bowel sounds are normal.     Palpations: Abdomen is soft. There is no hepatomegaly or splenomegaly.  Musculoskeletal:        General: Normal range of  motion.     Cervical back: Normal range of motion and neck supple.     Right lower leg: No edema.     Left lower leg: No edema.  Skin:    General: Skin is warm and dry.     Capillary Refill: Capillary refill takes less than 2 seconds.  Neurological:     Mental Status: He is alert and oriented to person, place, and time.  Psychiatric:        Attention and Perception: Attention normal.        Mood and Affect: Mood normal.        Speech: Speech normal.        Behavior: Behavior normal. Behavior is cooperative.        Thought Content: Thought content normal.    Results for orders placed or performed in visit on 09/17/19  PSA  Result Value Ref Range   Prostate Specific Ag, Serum 3.5 0.0 - 4.0 ng/mL  TSH  Result Value Ref Range   TSH 1.150 0.450 - 4.500 uIU/mL      Assessment & Plan:   Problem List Items Addressed This Visit      Cardiovascular and Mediastinum   Essential hypertension    Chronic, ongoing with elevation in BP today initially, repeat improved, but home readings significantly below goal at baseline.  Will continue focus on DASH diet at home and recommend complete smoking cessation.  Initiate medication as needed if home readings elevated.  He documents BP consistently, bringing flow sheets to visits. Return in 6 months.  Check BMP today.      Relevant Medications   simvastatin (ZOCOR) 80 MG tablet   Other Relevant Orders   Basic metabolic panel   Aortic atherosclerosis ([place])    Noted on Lung CT screening.  Recommend complete cessation of smoking.  Continue daily statin and ASA for prevention.      Relevant Medications   simvastatin (ZOCOR) 80 MG tablet   Aortic ectasia, abdominal ([facility])    Continue to monitor BP and ensure good control.  Recommend continue ASA and statin daily, adjust as needed + complete cessation smoking.  Repeat scan December 2021.      Relevant Medications   simvastatin (ZOCOR) 80 MG tablet     Respiratory   Centrilobular emphysema  ([place]) - Primary    Noted on February 2021 lung screening.  Will plan on PFT next visit.  Currently no symptoms and no inhaler regimen.  Will plan to initiate inhaler regimen as needed and recommend complete cessation of smoking.  Continue yearly lung screening.        Other   Pulmonary nodules    Followed yearly by nodule clinic.  Continue to monitor.  Recommend complete smoking cessation.      Vitamin D deficiency    Ongoing.  Continue supplement and recheck Vit D level today.  Adjust supplement as needed.      Relevant Orders   VITAMIN D  25 Hydroxy (Vit-D Deficiency, Fractures)   Hyperlipidemia    Chronic, ongoing.  Continue current medication regimen and adjust as needed.  Check lipid panel today, last LDL >70, may adjust statin dose if ongoing elevation.  He is not fasting today.      Relevant Medications   simvastatin (ZOCOR) 80 MG tablet   Other Relevant Orders   Lipid Panel w/o Chol/HDL Ratio   Nicotine dependence, cigarettes, uncomplicated    I have recommended complete cessation of tobacco use. I have discussed various options available for assistance with tobacco cessation including over the counter methods (Nicotine gum, patch and lozenges). We also discussed prescription options (Chantix, Nicotine Inhaler / Nasal Spray). The patient is not interested in pursuing any prescription tobacco cessation options at this time.           Follow up plan: Return in about 6 months (around 09/16/2020) for HTN/HLD, AAA (needs repeat u/s), COPD, Vit D -- need PFT at visit.

## 2020-03-18 LAB — LIPID PANEL W/O CHOL/HDL RATIO
Cholesterol, Total: 185 mg/dL (ref 100–199)
HDL: 72 mg/dL (ref >39)
LDL Chol Calc (NIH): 95 mg/dL (ref 0–99)
Triglycerides: 102 mg/dL (ref 0–149)
VLDL Cholesterol Cal: 18 mg/dL (ref 5–40)

## 2020-03-18 LAB — BASIC METABOLIC PANEL
BUN/Creatinine Ratio: 26 — ABNORMAL HIGH (ref 10–24)
BUN: 23 mg/dL (ref 8–27)
CO2: 23 mmol/L (ref 20–29)
Calcium: 9.8 mg/dL (ref 8.6–10.2)
Chloride: 107 mmol/L — ABNORMAL HIGH (ref 96–106)
Creatinine, Ser: 0.87 mg/dL (ref 0.76–1.27)
GFR calc Af Amer: 102 mL/min/{1.73_m2} (ref >59)
GFR calc non Af Amer: 88 mL/min/{1.73_m2} (ref >59)
Glucose: 103 mg/dL — ABNORMAL HIGH (ref 65–99)
Potassium: 4.5 mmol/L (ref 3.5–5.2)
Sodium: 139 mmol/L (ref 134–144)

## 2020-03-18 LAB — VITAMIN D 25 HYDROXY (VIT D DEFICIENCY, FRACTURES): Vit D, 25-Hydroxy: 36.6 ng/mL (ref 30.0–100.0)

## 2020-03-18 NOTE — Progress Notes (Signed)
Contacted via [place] [name], your labs have returned.   - Kidney function remains stable.  I do recommend increasing water intake daily as chloride and BUN/creatinine ratio mildly elevated.  - Cholesterol levels show total cholesterol 185 and LDL 95, you may benefit from change to Rosuvastatin and stopping Simvastatin.  You are on highest dose Simvastatin and LDL would benefit from tighter control for stroke prevention.  Would like to see it less than 70.  Rosuvastatin 40 MG is considered a high intensity statin and may offer tighter control.  If you would like to try this let me know or we can discuss further next visit.   - Vitamin D level remains normal If any questions please reach out. Keep being awesome!! Kindest regards, Kamaryn Grimley

## 2020-03-19 ENCOUNTER — Other Ambulatory Visit: Payer: Self-pay | Admitting: Nurse Practitioner

## 2020-03-19 MED ORDER — ROSUVASTATIN CALCIUM 40 MG PO TABS: 40.0000 mg | ORAL_TABLET | Freq: Every day | ORAL | 3 refills | Status: DC

## 2020-09-06 ENCOUNTER — Ambulatory Visit: Payer: PPO

## 2020-09-06 ENCOUNTER — Telehealth: Payer: Self-pay

## 2020-09-06 NOTE — Telephone Encounter (Signed)
This nurse attempted to call patient in order to perform scheduled telephonic AWV. I called three times at [phone], [phone], and [phone]. Message left for patient to call and reschedule for another time.

## 2020-09-24 ENCOUNTER — Ambulatory Visit: Payer: PPO

## 2020-09-27 ENCOUNTER — Ambulatory Visit (INDEPENDENT_AMBULATORY_CARE_PROVIDER_SITE_OTHER): Payer: PPO

## 2020-09-27 VITALS — Ht 69.5 in | Wt 208.0 lb

## 2020-09-27 DIAGNOSIS — Z0000 Encounter for general adult medical examination without abnormal findings: Secondary | ICD-10-CM | POA: Diagnosis not present

## 2020-09-27 NOTE — Patient Instructions (Signed)
Jim Bauer , Thank you for taking time to come for your Medicare Wellness Visit. I appreciate your ongoing commitment to your health goals. Please review the following plan we discussed and let me know if I can assist you in the future.   Screening recommendations/referrals: Colonoscopy: completed 05/07/2019, due 05/06/2022 Recommended yearly ophthalmology/optometry visit for glaucoma screening and checkup Recommended yearly dental visit for hygiene and checkup  Vaccinations: Influenza vaccine: completed 07/19/2020, due 04/18/2021 Pneumococcal vaccine: completed 06/18/2019 Tdap vaccine: completed 12/10/2010, due 12/09/2020 Shingles vaccine: completed   Covid-19:  07/12/2020, 10/28/2019, 09/30/2019  Advanced directives: copy in chart  Conditions/risks identified: smoking  Next appointment: Follow up in one year for your annual wellness visit.   Preventive Care 14 Years and Older, Male Preventive care refers to lifestyle choices and visits with your health care provider that can promote health and wellness. What does preventive care include?  A yearly physical exam. This is also called an annual well check.  Dental exams once or twice a year.  Routine eye exams. Ask your health care provider how often you should have your eyes checked.  Personal lifestyle choices, including:  Daily care of your teeth and gums.  Regular physical activity.  Eating a healthy diet.  Avoiding tobacco and drug use.  Limiting alcohol use.  Practicing safe sex.  Taking low doses of aspirin every day.  Taking vitamin and mineral supplements as recommended by your health care provider. What happens during an annual well check? The services and screenings done by your health care provider during your annual well check will depend on your age, overall health, lifestyle risk factors, and family history of disease. Counseling  Your health care provider may ask you questions about your:  Alcohol  use.  Tobacco use.  Drug use.  Emotional well-being.  Home and relationship well-being.  Sexual activity.  Eating habits.  History of falls.  Memory and ability to understand (cognition).  Work and work Statistician. Screening  You may have the following tests or measurements:  Height, weight, and BMI.  Blood pressure.  Lipid and cholesterol levels. These may be checked every 5 years, or more frequently if you are over 6 years old.  Skin check.  Lung cancer screening. You may have this screening every year starting at age 23 if you have a 30-pack-year history of smoking and currently smoke or have quit within the past 15 years.  Fecal occult blood test (FOBT) of the stool. You may have this test every year starting at age 20.  Flexible sigmoidoscopy or colonoscopy. You may have a sigmoidoscopy every 5 years or a colonoscopy every 10 years starting at age 64.  Prostate cancer screening. Recommendations will vary depending on your family history and other risks.  Hepatitis C blood test.  Hepatitis B blood test.  Sexually transmitted disease (STD) testing.  Diabetes screening. This is done by checking your blood sugar (glucose) after you have not eaten for a while (fasting). You may have this done every 1-3 years.  Abdominal aortic aneurysm (AAA) screening. You may need this if you are a current or former smoker.  Osteoporosis. You may be screened starting at age 31 if you are at high risk. Talk with your health care provider about your test results, treatment options, and if necessary, the need for more tests. Vaccines  Your health care provider may recommend certain vaccines, such as:  Influenza vaccine. This is recommended every year.  Tetanus, diphtheria, and acellular pertussis (Tdap, Td) vaccine.  You may need a Td booster every 10 years.  Zoster vaccine. You may need this after age 37.  Pneumococcal 13-valent conjugate (PCV13) vaccine. One dose is  recommended after age 77.  Pneumococcal polysaccharide (PPSV23) vaccine. One dose is recommended after age 10. Talk to your health care provider about which screenings and vaccines you need and how often you need them. This information is not intended to replace advice given to you by your health care provider. Make sure you discuss any questions you have with your health care provider. Document Released: 10/01/2015 Document Revised: 05/24/2016 Document Reviewed: 07/06/2015 Elsevier Interactive Patient Education  2017 [place] Prevention in the Home Falls can cause injuries. They can happen to people of all ages. There are many things you can do to make your home safe and to help prevent falls. What can I do on the outside of my home?  Regularly fix the edges of walkways and driveways and fix any cracks.  Remove anything that might make you trip as you walk through a door, such as a raised step or threshold.  Trim any bushes or trees on the path to your home.  Use bright outdoor lighting.  Clear any walking paths of anything that might make someone trip, such as rocks or tools.  Regularly check to see if handrails are loose or broken. Make sure that both sides of any steps have handrails.  Any raised decks and porches should have guardrails on the edges.  Have any leaves, snow, or ice cleared regularly.  Use sand or salt on walking paths during winter.  Clean up any spills in your garage right away. This includes oil or grease spills. What can I do in the bathroom?  Use night lights.  Install grab bars by the toilet and in the tub and shower. Do not use towel bars as grab bars.  Use non-skid mats or decals in the tub or shower.  If you need to sit down in the shower, use a plastic, non-slip stool.  Keep the floor dry. Clean up any water that spills on the floor as soon as it happens.  Remove soap buildup in the tub or shower regularly.  Attach bath mats  securely with double-sided non-slip rug tape.  Do not have throw rugs and other things on the floor that can make you trip. What can I do in the bedroom?  Use night lights.  Make sure that you have a light by your bed that is easy to reach.  Do not use any sheets or blankets that are too big for your bed. They should not hang down onto the floor.  Have a firm chair that has side arms. You can use this for support while you get dressed.  Do not have throw rugs and other things on the floor that can make you trip. What can I do in the kitchen?  Clean up any spills right away.  Avoid walking on wet floors.  Keep items that you use a lot in easy-to-reach places.  If you need to reach something above you, use a strong step stool that has a grab bar.  Keep electrical cords out of the way.  Do not use floor polish or wax that makes floors slippery. If you must use wax, use non-skid floor wax.  Do not have throw rugs and other things on the floor that can make you trip. What can I do with my stairs?  Do not leave any  items on the stairs.  Make sure that there are handrails on both sides of the stairs and use them. Fix handrails that are broken or loose. Make sure that handrails are as long as the stairways.  Check any carpeting to make sure that it is firmly attached to the stairs. Fix any carpet that is loose or worn.  Avoid having throw rugs at the top or bottom of the stairs. If you do have throw rugs, attach them to the floor with carpet tape.  Make sure that you have a light switch at the top of the stairs and the bottom of the stairs. If you do not have them, ask someone to add them for you. What else can I do to help prevent falls?  Wear shoes that:  Do not have high heels.  Have rubber bottoms.  Are comfortable and fit you well.  Are closed at the toe. Do not wear sandals.  If you use a stepladder:  Make sure that it is fully opened. Do not climb a closed  stepladder.  Make sure that both sides of the stepladder are locked into place.  Ask someone to hold it for you, if possible.  Clearly mark and make sure that you can see:  Any grab bars or handrails.  First and last steps.  Where the edge of each step is.  Use tools that help you move around (mobility aids) if they are needed. These include:  Canes.  Walkers.  Scooters.  Crutches.  Turn on the lights when you go into a dark area. Replace any light bulbs as soon as they burn out.  Set up your furniture so you have a clear path. Avoid moving your furniture around.  If any of your floors are uneven, fix them.  If there are any pets around you, be aware of where they are.  Review your medicines with your doctor. Some medicines can make you feel dizzy. This can increase your chance of falling. Ask your doctor what other things that you can do to help prevent falls. This information is not intended to replace advice given to you by your health care provider. Make sure you discuss any questions you have with your health care provider. Document Released: 07/01/2009 Document Revised: 02/10/2016 Document Reviewed: 10/09/2014 Elsevier Interactive Patient Education  2017 [employer].

## 2020-09-27 NOTE — Progress Notes (Signed)
I connected with Jim Bauer today by telephone and verified that I am speaking with the correct person using two identifiers. Location patient: home Location provider: work Persons participating in the virtual visit: Maijor, Hornig LPN.   I discussed the limitations, risks, security and privacy concerns of performing an evaluation and management service by telephone and the availability of in person appointments. I also discussed with the patient that there may be a patient responsible charge related to this service. The patient expressed understanding and verbally consented to this telephonic visit.    Interactive audio and video telecommunications were attempted between this provider and patient, however failed, due to patient having technical difficulties OR patient did not have access to video capability.  We continued and completed visit with audio only.     Vital signs may be patient reported or missing  Subjective:   Jim Bauer is a 70 y.o. male who presents for Medicare Annual/Subsequent preventive examination.  Review of Systems     Cardiac Risk Factors include: advanced age (>29men, >67 women);dyslipidemia;hypertension;male gender;obesity (BMI >30kg/m2);smoking/ tobacco exposure     Objective:    Today's Vitals   09/27/20 0811  Weight: 208 lb (94.3 kg)  Height: 5' 9.5" (1.765 m)   Body mass index is 30.28 kg/m.  Advanced Directives 09/27/2020 09/03/2019 05/07/2019  Does Patient Have a Medical Advance Directive? Yes Yes Yes  Type of Paramedic of [place];Living will Living will;Healthcare Power of [place];Living will  Copy of [place] in Chart? Yes - validated most recent copy scanned in chart (See row information) No - copy requested No - copy requested    Current Medications (verified) Outpatient Encounter Medications as of 09/27/2020  Medication Sig  . aspirin EC 81 MG  tablet Take 81 mg by mouth daily.  . Cholecalciferol (D2000 ULTRA STRENGTH) 50 MCG (2000 UT) CAPS Take 2,000 Units by mouth daily.  . Omega 3 1000 MG CAPS Take 1 capsule by mouth daily.  . rosuvastatin (CRESTOR) 40 MG tablet Take 1 tablet (40 mg total) by mouth daily.   No facility-administered encounter medications on file as of 09/27/2020.    Allergies (verified) Bee venom   History: Past Medical History:  Diagnosis Date  . Allergy 2015  . Centrilobular emphysema ([place]) 10/28/2019  . Essential hypertension   . Hyperlipidemia   . Personal history of tobacco use, presenting hazards to health 08/20/2015   Past Surgical History:  Procedure Laterality Date  . COLONOSCOPY    . COLONOSCOPY WITH PROPOFOL N/A 06/21/2015   Procedure: COLONOSCOPY WITH PROPOFOL;  Surgeon: Lollie Sails, MD;  Location: [facility] ENDOSCOPY;  Service: Endoscopy;  Laterality: N/A;  . COLONOSCOPY WITH PROPOFOL N/A 05/07/2019   Procedure: COLONOSCOPY WITH PROPOFOL;  Surgeon: Jonathon Bellows, MD;  Location: [facility] ENDOSCOPY;  Service: Gastroenterology;  Laterality: N/A;  . KNEE ARTHROSCOPY    . TONSILLECTOMY     Family History  Problem Relation Age of Onset  . Macular degeneration Mother   . Diabetes Father   . Parkinson's disease Father   . Hypertension Father   . Obesity Brother   . Sleep apnea Brother   . Melanoma Daughter   . Diverticulitis Maternal Grandmother   . Alcohol abuse Maternal Grandfather   . Heart disease Paternal Grandmother   . Stroke Paternal Grandfather    Social History   Socioeconomic History  . Marital status: Widowed    Spouse name: Not on file  . Number  of children: Not on file  . Years of education: Not on file  . Highest education level: Not on file  Occupational History  . Occupation: retired  Tobacco Use  . Smoking status: Current Every Day Smoker    Packs/day: 1.00    Years: 43.00    Pack years: 43.00    Types: Cigarettes  . Smokeless tobacco: Never Used  Vaping Use  .  Vaping Use: Never used  Substance and Sexual Activity  . Alcohol use: Yes    Alcohol/week: 14.0 standard drinks    Types: 14 Glasses of wine per week  . Drug use: Never  . Sexual activity: Not Currently    Birth control/protection: None  Other Topics Concern  . Not on file  Social History Narrative  . Not on file   Social Determinants of Health   Financial Resource Strain: Low Risk   . Difficulty of Paying Living Expenses: Not hard at all  Food Insecurity: No Food Insecurity  . Worried About Charity fundraiser in the Last Year: Never true  . Ran Out of Food in the Last Year: Never true  Transportation Needs: No Transportation Needs  . Lack of Transportation (Medical): No  . Lack of Transportation (Non-Medical): No  Physical Activity: Sufficiently Active  . Days of Exercise per Week: 3 days  . Minutes of Exercise per Session: 150+ min  Stress: No Stress Concern Present  . Feeling of Stress : Not at all  Social Connections: Not on file    Tobacco Counseling Ready to quit: No Counseling given: Not Answered   Clinical Intake:  Pre-visit preparation completed: Yes  Pain : No/denies pain     Nutritional Status: BMI > 30  Obese Nutritional Risks: None Diabetes: No  How often do you need to have someone help you when you read instructions, pamphlets, or other written materials from your doctor or pharmacy?: 1 - Never What is the last grade level you completed in school?: graduate school  Diabetic? no  Interpreter Needed?: No  Information entered by :: NAllen LPN   Activities of Daily Living In your present state of health, do you have any difficulty performing the following activities: 09/27/2020  Hearing? N  Vision? N  Difficulty concentrating or making decisions? N  Walking or climbing stairs? N  Dressing or bathing? N  Doing errands, shopping? N  Preparing Food and eating ? N  Using the Toilet? N  In the past six months, have you accidently leaked urine?  Y  Do you have problems with loss of bowel control? N  Managing your Medications? N  Managing your Finances? N  Housekeeping or managing your Housekeeping? N  Some recent data might be hidden    Patient Care Team: Venita Lick, NP as PCP - General (Nurse Practitioner)  Indicate any recent Medical Services you may have received from other than [facility] providers in the past year (date may be approximate).     Assessment:   This is a routine wellness examination for Trevin.  Hearing/Vision screen No exam data present  Dietary issues and exercise activities discussed: Current Exercise Habits: Home exercise routine, Type of exercise: Other - see comments (golfing), Time (Minutes): > 60, Frequency (Times/Week): 3, Weekly Exercise (Minutes/Week): 0  Goals    . Patient Stated     09/27/2020, no goals      Depression Screen PHQ 2/9 Scores 09/27/2020 09/17/2019 09/17/2019 09/03/2019 06/18/2019 04/21/2019  PHQ - 2 Score 0 0 0 0  0 2  PHQ- 9 Score - 0 - - 1 2    Fall Risk Fall Risk  09/27/2020 09/17/2019 09/03/2019 04/21/2019  Falls in the past year? 0 1 0 0  Number falls in past yr: - 0 0 0  Injury with Fall? - 1 0 0  Risk for fall due to : Medication side effect - - -  Follow up Falls evaluation completed;Education provided;Falls prevention discussed - - Falls evaluation completed    FALL RISK PREVENTION PERTAINING TO THE HOME:  Any stairs in or around the home? Yes  If so, are there any without handrails? No  Home free of loose throw rugs in walkways, pet beds, electrical cords, etc? Yes  Adequate lighting in your home to reduce risk of falls? Yes   ASSISTIVE DEVICES UTILIZED TO PREVENT FALLS:  Life alert? No  Use of a cane, walker or w/c? No  Grab bars in the bathroom? Yes  Shower chair or bench in shower? Yes  Elevated toilet seat or a handicapped toilet? Yes   TIMED UP AND GO:  Was the test performed? No .     Cognitive Function:     6CIT Screen 09/27/2020  09/17/2019  What Year? 0 points 0 points  What month? 0 points 0 points  What time? 0 points 0 points  Count back from 20 0 points 0 points  Months in reverse 0 points 0 points  Repeat phrase 0 points 0 points  Total Score 0 0    Immunizations Immunization History  Administered Date(s) Administered  . Fluad Quad(high Dose 65+) 06/18/2019  . Hepatitis A 10/30/2006, 05/16/2007  . IPV 05/01/2007  . Influenza Split 08/10/2014, 06/25/2015, 06/24/2018  . Influenza, High Dose Seasonal PF 07/19/2020  . Influenza-Unspecified 06/25/2015, 08/26/2016, 06/15/2017  . PFIZER SARS-COV-2 Vaccination 09/30/2019, 10/28/2019, 07/12/2020  . Pneumococcal Conjugate-13 06/15/2017  . Pneumococcal Polysaccharide-23 08/08/2013, 06/18/2019  . Td 10/30/2006  . Tdap 12/10/2010  . Zoster Recombinat (Shingrix) 04/25/2018, 11/08/2018    TDAP status: Up to date  Flu Vaccine status: Up to date  Pneumococcal vaccine status: Up to date  Covid-19 vaccine status: Completed vaccines  Qualifies for Shingles Vaccine? Yes   Zostavax completed No   Shingrix Completed?: Yes  Screening Tests Health Maintenance  Topic Date Due  . Hepatitis C Screening  Never done  . TETANUS/TDAP  12/09/2020  . COLONOSCOPY (Pts 45-32yrs Insurance coverage will need to be confirmed)  05/06/2022  . INFLUENZA VACCINE  Completed  . COVID-19 Vaccine  Completed  . PNA vac Low Risk Adult  Completed    Health Maintenance  Health Maintenance Due  Topic Date Due  . Hepatitis C Screening  Never done    Colorectal cancer screening: Type of screening: Colonoscopy. Completed 05/07/2019. Repeat every 3 years  Lung Cancer Screening: (Low Dose CT Chest recommended if Age 27-80 years, 30 pack-year currently smoking OR have quit w/in 15years.) does qualify.   Lung Cancer Screening Referral: completed 10/24/2019  Additional Screening:  Hepatitis C Screening: does qualify; decline  Vision Screening: Recommended annual ophthalmology exams  for early detection of glaucoma and other disorders of the eye. Is the patient up to date with their annual eye exam?  Yes  Who is the provider or what is the name of the office in which the patient attends annual eye exams? [facility] If pt is not established with a provider, would they like to be referred to a provider to establish care? No .   Dental  Screening: Recommended annual dental exams for proper oral hygiene  Community Resource Referral / Chronic Care Management: CRR required this visit?  No   CCM required this visit?  No      Plan:     I have personally reviewed and noted the following in the patient's chart:   . Medical and social history . Use of alcohol, tobacco or illicit drugs  . Current medications and supplements . Functional ability and status . Nutritional status . Physical activity . Advanced directives . List of other physicians . Hospitalizations, surgeries, and ER visits in previous 12 months . Vitals . Screenings to include cognitive, depression, and falls . Referrals and appointments  In addition, I have reviewed and discussed with patient certain preventive protocols, quality metrics, and best practice recommendations. A written personalized care plan for preventive services as well as general preventive health recommendations were provided to patient.     Kellie Simmering, LPN   [id number]   Nurse Notes:

## 2020-09-29 ENCOUNTER — Other Ambulatory Visit: Payer: Self-pay

## 2020-09-29 ENCOUNTER — Encounter: Payer: Self-pay | Admitting: Nurse Practitioner

## 2020-09-29 ENCOUNTER — Ambulatory Visit (INDEPENDENT_AMBULATORY_CARE_PROVIDER_SITE_OTHER): Payer: PPO | Admitting: Nurse Practitioner

## 2020-09-29 VITALS — BP 130/72 | HR 90 | Temp 98.7°F | Ht 69.76 in | Wt 210.6 lb

## 2020-09-29 DIAGNOSIS — J432 Centrilobular emphysema: Secondary | ICD-10-CM

## 2020-09-29 DIAGNOSIS — Z6830 Body mass index (BMI) 30.0-30.9, adult: Secondary | ICD-10-CM

## 2020-09-29 DIAGNOSIS — I700 Atherosclerosis of aorta: Secondary | ICD-10-CM | POA: Diagnosis not present

## 2020-09-29 DIAGNOSIS — I77811 Abdominal aortic ectasia: Secondary | ICD-10-CM

## 2020-09-29 DIAGNOSIS — E6609 Other obesity due to excess calories: Secondary | ICD-10-CM

## 2020-09-29 DIAGNOSIS — I10 Essential (primary) hypertension: Secondary | ICD-10-CM | POA: Diagnosis not present

## 2020-09-29 DIAGNOSIS — F17210 Nicotine dependence, cigarettes, uncomplicated: Secondary | ICD-10-CM | POA: Diagnosis not present

## 2020-09-29 DIAGNOSIS — E559 Vitamin D deficiency, unspecified: Secondary | ICD-10-CM | POA: Diagnosis not present

## 2020-09-29 DIAGNOSIS — E669 Obesity, unspecified: Secondary | ICD-10-CM | POA: Insufficient documentation

## 2020-09-29 DIAGNOSIS — Z125 Encounter for screening for malignant neoplasm of prostate: Secondary | ICD-10-CM

## 2020-09-29 DIAGNOSIS — E782 Mixed hyperlipidemia: Secondary | ICD-10-CM

## 2020-09-29 NOTE — Assessment & Plan Note (Signed)
I have recommended complete cessation of tobacco use. I have discussed various options available for assistance with tobacco cessation including over the counter methods (Nicotine gum, patch and lozenges). We also discussed prescription options (Chantix, Nicotine Inhaler / Nasal Spray). The patient is not interested in pursuing any prescription tobacco cessation options at this time.  Continue yearly lung screening, due in February.

## 2020-09-29 NOTE — Assessment & Plan Note (Signed)
Noted on February 2021 lung screening.  Spirometry today FEV1 109% and FEV1/FVC 108% pre.  Currently no symptoms and no inhaler regimen.  Will plan to initiate inhaler regimen as needed and recommend complete cessation of smoking.  Continue yearly lung screening.

## 2020-09-29 NOTE — Patient Instructions (Signed)
Abdominal Aortic Aneurysm  An aneurysm is a bulge in a blood vessel that carries blood away from the heart (artery). It happens when blood pushes against a weak or damaged place on the wall of the blood vessel. An abdominal aortic aneurysm happens in the main blood vessel that carries blood away from the heart (aorta). Most aneurysms do not cause problems, but some do cause problems. If an aneurysm grows, it can burst or tear. This causes bleeding inside you. It is an emergency. It can be life-threatening. What are the causes? The exact cause of this condition is not known. What increases the risk? The following factors may make you more likely to develop this condition:  Being male and 60 years of age or older.  Being of [place] descent.  Using nicotine or tobacco now or in the past.  Having a family history of aneurysms.  Having any of these problems: ? Hardening of blood vessels that carry blood away from the heart. ? Irritation and swelling of the walls of blood vessels that carry blood away from the heart. ? Certain genetic problems. ? Being very overweight. ? An infection in the wall of your aorta. ? High cholesterol. ? High blood pressure (hypertension). What are the signs or symptoms? Symptoms depend on the size of your aneurysm and how fast it is growing. Most aneurysms grow slowly and do not cause symptoms. If symptoms happen, you may:  Have very bad pain in your belly (abdomen), side, or low back.  Feel full after eating only a little food.  Feel a throbbing lump in your belly.  Have these problems with your feet or toes: ? Pain. ? Skin turning blue. ? Sores.  Have trouble pooping (constipation).  Have trouble peeing (urinating). If your aneurysm bursts, you may:  Feel sudden, very bad pain in the belly, side, or back.  Feel like you may vomit.  Vomit.  Feel light-headed.  Faint. How is this treated? Treatment for this condition depends  on:  The size of your aneurysm.  How fast it is growing.  Your age.  Your risk of having the aneurysm burst. If your aneurysm is smaller than 2 inches (5 cm), your doctor may:  Check it often to see if it is growing. You may have an imaging test (ultrasound) to check it every 3-6 months, every year, or every few years.  Give you medicines for: ? High blood pressure. ? Pain. ? Infection. If your aneurysm is larger than 2 inches (5 cm), you may need surgery to fix it. Follow these instructions at home: Eating and drinking  Eat a heart-healthy diet. Eat a lot of: ? Fresh fruits and vegetables. ? Whole grains. ? Low-fat (lean) protein. ? Low-fat dairy products.  Avoid foods that are high in saturated fat and cholesterol. These foods include red meat and some dairy products.   Lifestyle  Do not use any products that contain nicotine or tobacco, such as cigarettes, e-cigarettes, and chewing tobacco. If you need help quitting, ask your doctor.  Stay active and get exercise. Ask your doctor how often to exercise and what types of exercise are safe for you.  Keep a healthy weight.      Alcohol use  Do not drink alcohol if: ? Your doctor tells you not to drink. ? You are pregnant, may be pregnant, or are planning to become pregnant.  If you drink alcohol: ? Limit how much you use to:  0-1 drink a day   for women.  0-2 drinks a day for men. ? Be aware of how much alcohol is in your drink. In the [place] one drink equals one 12 oz bottle of beer (355 mL), one 5 oz glass of wine (148 mL), or one 1 oz glass of hard liquor (44 mL). General instructions  Take over-the-counter and prescription medicines only as told by your doctor.  Keep your blood pressure in a normal range. Check it at regular times. Ask your doctor what level it should be.  Have regular checks of your levels of blood sugar (glucose) and cholesterol. Follow steps to keep these levels near normal.  Avoid heavy  lifting and activities that take a lot of effort. Ask what activities are safe for you.  If you can, learn your family's health history.  Keep all follow-up visits as told by your doctor. This is important. Contact a doctor if:  Your belly, side, or back hurts.  Your belly throbs.  You have a fever. Get help right away if:  You have sudden, bad pain in your belly, side, or back.  You feel like you may vomit or you vomit.  You feel light-headed or you faint.  Your heart beats fast when you stand.  You have sweaty skin that is cold to the touch (clammy).  You are short of breath.  You have trouble pooping.  You have trouble peeing. These symptoms may be an emergency. Do not wait to see if the symptoms will go away. Get medical help right away. Call your local emergency services (911 in the [place] Do not drive yourself to the hospital. Summary  An aneurysm is a bulge in one of the blood vessels that carry blood away from the heart (artery). An abdominal aortic aneurysm happens in the main blood vessel that carries blood away from the heart (aorta).  This condition can cause bleeding inside the body. It can be life-threatening.  Risk can rise if you are male, age 60 or older, and of [place] descent. Risk can also rise from nicotine or tobacco use or having aneurysms in the family.  Get help right away if you have symptoms of a burst aneurysm. This information is not intended to replace advice given to you by your health care provider. Make sure you discuss any questions you have with your health care provider. Document Revised: 06/20/2019 Document Reviewed: 06/20/2019 Elsevier Patient Education  2021 Elsevier Inc.  

## 2020-09-29 NOTE — Assessment & Plan Note (Signed)
BMI 30.42, but very active at baseline.  Recommended eating smaller high protein, low fat meals more frequently and exercising 30 mins a day 5 times a week with a goal of 10-15lb weight loss in the next 3 months. Patient voiced their understanding and motivation to adhere to these recommendations.

## 2020-09-29 NOTE — Assessment & Plan Note (Signed)
Noted on Lung CT screening.  Recommend complete cessation of smoking.  Continue daily statin and ASA for prevention. 

## 2020-09-29 NOTE — Assessment & Plan Note (Signed)
Chronic, ongoing with elevation in BP today initially, repeat improved, but home readings significantly below goal at baseline.  Will continue focus on DASH diet at home and recommend complete smoking cessation.  Initiate medication as needed if home readings elevated.  He documents BP consistently, bringing flow sheets to visits. Return in 6 months.  Check BMP and TSH today.

## 2020-09-29 NOTE — Assessment & Plan Note (Signed)
Chronic, ongoing.  Continue current medication regimen and adjust as needed.  Check lipid panel today, last LDL >70, may adjust statin dose if ongoing elevation.  Is fasting today -- recently changed from Simvastatin to Rosuvastatin high dose.

## 2020-09-29 NOTE — Progress Notes (Addendum)
BP 130/72   Pulse 90   Temp 98.7 F (37.1 C) (Oral)   Ht 5' 9.76" (1.772 m)   Wt 210 lb 9.6 oz (95.5 kg)   SpO2 96%   BMI 30.42 kg/m    Subjective:    Patient ID: Jim Bauer, male    DOB: 08-Aug-1951, 70 y.o.   MRN: [id number]  HPI: Jim Bauer is a 70 y.o. male  Chief Complaint  Patient presents with  . Hypertension  . COPD   HYPERTENSION / HYPERLIPIDEMIA Continues on Rosuvastatin 40 MG (swtiched to this last visit, had been on Simvastatin 80 MG) and ASA, no current BP medications. Last LDL 95 and TCHOL 185.   Did have AAA screening in December 2016 noting "Abdominal aortic ectasia 2.8 cm. Ectatic abdominal aorta at risk for aneurysm development. Recommend follow-up by ultrasound in 5 years".  To repeat in December 2021. Satisfied with current treatment?yes Duration of hypertension:chronic BP monitoring frequency:daily BP range:10/55 to 136/66 -- on average at goal <130/80 BP medication side effects:no Duration of hyperlipidemia:chronic Cholesterol medication side effects:no, Cholesterol supplements: fish oil Medication compliance:good compliance Aspirin:yes Recent stressors:no Recurrent headaches:no Visual changes:no Palpitations:no Dyspnea:no Chest pain:no Lower extremity edema:no Dizzy/lightheaded:no  VITAMIN D DEFICIENCY: Continues on daily supplement. No recent falls or fractures. Recent Vitamin D level 38.9 and CA+ 10.3.    COPD CT lung screening noting mild centrilobular emphysema and aortic atherosclerosis on recent scan 10/24/19.  Smoking a little less then 1 PPD.  COPD status: stable Satisfied with current treatment?: yes Oxygen use: no Dyspnea frequency: none Cough frequency: occasional Rescue inhaler frequency:  none Limitation of activity: no Productive cough: none Last Spirometry: unknown Pneumovax: Up to Date Influenza: Up to Date   Relevant past medical, surgical, family and social history reviewed and  updated as indicated. Interim medical history since our last visit reviewed. Allergies and medications reviewed and updated.  Review of Systems  Constitutional: Negative for activity change, diaphoresis, fatigue and fever.  Respiratory: Negative for cough, chest tightness, shortness of breath and wheezing.   Cardiovascular: Negative for chest pain, palpitations and leg swelling.  Gastrointestinal: Negative.   Neurological: Negative.   Psychiatric/Behavioral: Negative.     Per HPI unless specifically indicated above     Objective:    BP 130/72   Pulse 90   Temp 98.7 F (37.1 C) (Oral)   Ht 5' 9.76" (1.772 m)   Wt 210 lb 9.6 oz (95.5 kg)   SpO2 96%   BMI 30.42 kg/m   Wt Readings from Last 3 Encounters:  09/29/20 210 lb 9.6 oz (95.5 kg)  09/27/20 208 lb (94.3 kg)  03/17/20 206 lb 9.6 oz (93.7 kg)    Physical Exam Vitals and nursing note reviewed.  Constitutional:      General: He is awake. He is not in acute distress.    Appearance: He is well-developed, well-groomed and overweight. He is not ill-appearing.  HENT:     Head: Normocephalic and atraumatic.     Right Ear: Hearing normal. No drainage.     Left Ear: Hearing normal. No drainage.  Eyes:     General: Lids are normal.        Right eye: No discharge.        Left eye: No discharge.     Conjunctiva/sclera: Conjunctivae normal.     Pupils: Pupils are equal, round, and reactive to light.  Neck:     Thyroid: No thyromegaly.     Vascular: No carotid bruit.  Trachea: Trachea normal.  Cardiovascular:     Rate and Rhythm: Normal rate and regular rhythm.     Heart sounds: Normal heart sounds, S1 normal and S2 normal. No murmur heard. No gallop.   Pulmonary:     Effort: Pulmonary effort is normal. No accessory muscle usage or respiratory distress.     Breath sounds: Normal breath sounds.  Abdominal:     General: Bowel sounds are normal.     Palpations: Abdomen is soft. There is no hepatomegaly or splenomegaly.   Musculoskeletal:        General: Normal range of motion.     Cervical back: Normal range of motion and neck supple.     Right lower leg: No edema.     Left lower leg: No edema.  Skin:    General: Skin is warm and dry.     Capillary Refill: Capillary refill takes less than 2 seconds.  Neurological:     Mental Status: He is alert and oriented to person, place, and time.  Psychiatric:        Attention and Perception: Attention normal.        Mood and Affect: Mood normal.        Speech: Speech normal.        Behavior: Behavior normal. Behavior is cooperative.        Thought Content: Thought content normal.    Results for orders placed or performed in visit on [id number]  Basic metabolic panel  Result Value Ref Range   Glucose 103 (H) 65 - 99 mg/dL   BUN 23 8 - 27 mg/dL   Creatinine, Ser 0.87 0.76 - 1.27 mg/dL   GFR calc non Af Amer 88 >59 mL/min/1.73   GFR calc Af Amer 102 >59 mL/min/1.73   BUN/Creatinine Ratio 26 (H) 10 - 24   Sodium 139 134 - 144 mmol/L   Potassium 4.5 3.5 - 5.2 mmol/L   Chloride 107 (H) 96 - 106 mmol/L   CO2 23 20 - 29 mmol/L   Calcium 9.8 8.6 - 10.2 mg/dL  Lipid Panel w/o Chol/HDL Ratio  Result Value Ref Range   Cholesterol, Total 185 100 - 199 mg/dL   Triglycerides 102 0 - 149 mg/dL   HDL 72 >39 mg/dL   VLDL Cholesterol Cal 18 5 - 40 mg/dL   LDL Chol Calc (NIH) 95 0 - 99 mg/dL  VITAMIN D 25 Hydroxy (Vit-D Deficiency, Fractures)  Result Value Ref Range   Vit D, 25-Hydroxy 36.6 30.0 - 100.0 ng/mL      Assessment & Plan:   Problem List Items Addressed This Visit      Cardiovascular and Mediastinum   Essential hypertension    Chronic, ongoing with elevation in BP today initially, repeat improved, but home readings significantly below goal at baseline.  Will continue focus on DASH diet at home and recommend complete smoking cessation.  Initiate medication as needed if home readings elevated.  He documents BP consistently, bringing flow sheets to visits.  Return in 6 months.  Check BMP and TSH today.      Relevant Medications   EPINEPHrine (EPIPEN 2-PAK) 0.3 mg/0.3 mL IJ SOAJ injection   Other Relevant Orders   TSH   Basic metabolic panel   Aortic atherosclerosis ([place])    Noted on Lung CT screening.  Recommend complete cessation of smoking.  Continue daily statin and ASA for prevention.      Relevant Medications   EPINEPHrine (EPIPEN 2-PAK) 0.3 mg/0.3 mL IJ  SOAJ injection   Aortic ectasia, abdominal (HCC)    Continue to monitor BP and ensure good control.  Recommend continue ASA and statin daily, adjust as needed + complete cessation smoking.  Repeat scan ordered and patient aware of need for this, if any changes send to vascular.      Relevant Medications   EPINEPHrine (EPIPEN 2-PAK) 0.3 mg/0.3 mL IJ SOAJ injection   Other Relevant Orders   US AORTA DUPLEX LIMITED     Respiratory   Centrilobular emphysema ([place]) - Primary    Noted on February 2021 lung screening.  Spirometry today FEV1 109% and FEV1/FVC 108% pre.  Currently no symptoms and no inhaler regimen.  Will plan to initiate inhaler regimen as needed and recommend complete cessation of smoking.  Continue yearly lung screening.      Relevant Orders   Spirometry with graph (Completed)   CBC with Differential/Platelet     Other   Vitamin D deficiency    Ongoing.  Continue supplement and recheck Vit D level next visit.  Adjust supplement as needed.      Hyperlipidemia    Chronic, ongoing.  Continue current medication regimen and adjust as needed.  Check lipid panel today, last LDL >70, may adjust statin dose if ongoing elevation.  Is fasting today -- recently changed from Simvastatin to Rosuvastatin high dose.      Relevant Medications   EPINEPHrine (EPIPEN 2-PAK) 0.3 mg/0.3 mL IJ SOAJ injection   Other Relevant Orders   Lipid Panel w/o Chol/HDL Ratio   Nicotine dependence, cigarettes, uncomplicated    I have recommended complete cessation of tobacco use. I have  discussed various options available for assistance with tobacco cessation including over the counter methods (Nicotine gum, patch and lozenges). We also discussed prescription options (Chantix, Nicotine Inhaler / Nasal Spray). The patient is not interested in pursuing any prescription tobacco cessation options at this time.  Continue yearly lung screening, due in February.      Obesity    BMI 30.42, but very active at baseline.  Recommended eating smaller high protein, low fat meals more frequently and exercising 30 mins a day 5 times a week with a goal of 10-15lb weight loss in the next 3 months. Patient voiced their understanding and motivation to adhere to these recommendations.        Other Visit Diagnoses    Prostate cancer screening       PSA on labs today, discussed with patient.   Relevant Orders   PSA       Follow up plan: Return in about 6 months (around 03/29/2021) for AAA, HTN/HLD, COPD, PREDIABETES.

## 2020-09-29 NOTE — Assessment & Plan Note (Signed)
Continue to monitor BP and ensure good control.  Recommend continue ASA and statin daily, adjust as needed + complete cessation smoking.  Repeat scan ordered and patient aware of need for this, if any changes send to vascular.

## 2020-09-29 NOTE — Assessment & Plan Note (Signed)
Ongoing.  Continue supplement and recheck Vit D level next visit.  Adjust supplement as needed.

## 2020-09-30 LAB — BASIC METABOLIC PANEL
BUN/Creatinine Ratio: 25 — ABNORMAL HIGH (ref 10–24)
BUN: 23 mg/dL (ref 8–27)
CO2: 22 mmol/L (ref 20–29)
Calcium: 9.9 mg/dL (ref 8.6–10.2)
Chloride: 108 mmol/L — ABNORMAL HIGH (ref 96–106)
Creatinine, Ser: 0.91 mg/dL (ref 0.76–1.27)
GFR calc Af Amer: 98 mL/min/{1.73_m2} (ref >59)
GFR calc non Af Amer: 85 mL/min/{1.73_m2} (ref >59)
Glucose: 127 mg/dL — ABNORMAL HIGH (ref 65–99)
Potassium: 4.4 mmol/L (ref 3.5–5.2)
Sodium: 142 mmol/L (ref 134–144)

## 2020-09-30 LAB — LIPID PANEL W/O CHOL/HDL RATIO
Cholesterol, Total: 161 mg/dL (ref 100–199)
HDL: 70 mg/dL (ref >39)
LDL Chol Calc (NIH): 73 mg/dL (ref 0–99)
Triglycerides: 101 mg/dL (ref 0–149)
VLDL Cholesterol Cal: 18 mg/dL (ref 5–40)

## 2020-09-30 LAB — CBC WITH DIFFERENTIAL/PLATELET
Basophils Absolute: 0 10*3/uL (ref 0.0–0.2)
Basos: 0 %
EOS (ABSOLUTE): 0.1 10*3/uL (ref 0.0–0.4)
Eos: 1 %
Hematocrit: 47.1 % (ref 37.5–51.0)
Hemoglobin: 15.7 g/dL (ref 13.0–17.7)
Immature Grans (Abs): 0 10*3/uL (ref 0.0–0.1)
Immature Granulocytes: 0 %
Lymphocytes Absolute: 2 10*3/uL (ref 0.7–3.1)
Lymphs: 34 %
MCH: 29.8 pg (ref 26.6–33.0)
MCHC: 33.3 g/dL (ref 31.5–35.7)
MCV: 89 fL (ref 79–97)
Monocytes Absolute: 0.5 10*3/uL (ref 0.1–0.9)
Monocytes: 8 %
Neutrophils Absolute: 3.2 10*3/uL (ref 1.4–7.0)
Neutrophils: 57 %
Platelets: 200 10*3/uL (ref 150–450)
RBC: 5.27 x10E6/uL (ref 4.14–5.80)
RDW: 13.6 % (ref 11.6–15.4)
WBC: 5.8 10*3/uL (ref 3.4–10.8)

## 2020-09-30 LAB — PSA: Prostate Specific Ag, Serum: 3.6 ng/mL (ref 0.0–4.0)

## 2020-09-30 LAB — TSH: TSH: 1.18 u[IU]/mL (ref 0.450–4.500)

## 2020-09-30 NOTE — Progress Notes (Signed)
Contacted via MyChart   Good evening Jim Bauer, your labs have returned.  Overall they look fantastic.  The only exception is glucose, sugar level, is a little elevated.  Next visit we will recheck your A1c, diabetes testing.  I do recommend heavy focus on diet, reducing sugar and foods high in carbohydrates, + continue to be active.  Hope you were able to go golfing today.  Have a wonderful evening!! Keep being awesome!!  Thank you for allowing me to participate in your care. Kindest regards, Javin Nong

## 2020-10-18 DIAGNOSIS — H2513 Age-related nuclear cataract, bilateral: Secondary | ICD-10-CM | POA: Diagnosis not present

## 2020-10-21 ENCOUNTER — Telehealth: Payer: Self-pay | Admitting: *Deleted

## 2020-10-21 NOTE — Telephone Encounter (Signed)
Attempted to contact patient for scheduling lung screening scan. Left message for patient to call [phone] to schedule the CT scan. My chart message also sent to patient.

## 2020-10-22 ENCOUNTER — Other Ambulatory Visit: Payer: Self-pay | Admitting: *Deleted

## 2020-10-22 DIAGNOSIS — F17200 Nicotine dependence, unspecified, uncomplicated: Secondary | ICD-10-CM

## 2020-10-22 DIAGNOSIS — Z87891 Personal history of nicotine dependence: Secondary | ICD-10-CM

## 2020-10-22 DIAGNOSIS — Z122 Encounter for screening for malignant neoplasm of respiratory organs: Secondary | ICD-10-CM

## 2020-10-22 NOTE — Progress Notes (Signed)
Contacted and scheduled for annual lung screening scan. Patient is a current smoker with a 45 pack year history.

## 2020-11-01 ENCOUNTER — Other Ambulatory Visit: Payer: Self-pay

## 2020-11-01 ENCOUNTER — Ambulatory Visit
Admission: RE | Admit: 2020-11-01 | Discharge: 2020-11-01 | Disposition: A | Discharge status: Home / Self Care | Payer: PPO | Source: Ambulatory Visit | Attending: Oncology | Admitting: Oncology

## 2020-11-01 DIAGNOSIS — Z87891 Personal history of nicotine dependence: Secondary | ICD-10-CM | POA: Insufficient documentation

## 2020-11-01 DIAGNOSIS — Z122 Encounter for screening for malignant neoplasm of respiratory organs: Secondary | ICD-10-CM | POA: Insufficient documentation

## 2020-11-01 DIAGNOSIS — F17210 Nicotine dependence, cigarettes, uncomplicated: Secondary | ICD-10-CM | POA: Diagnosis not present

## 2020-11-01 DIAGNOSIS — F17200 Nicotine dependence, unspecified, uncomplicated: Secondary | ICD-10-CM | POA: Insufficient documentation

## 2020-11-03 ENCOUNTER — Encounter: Payer: Self-pay | Admitting: *Deleted

## 2021-01-26 DIAGNOSIS — Z86018 Personal history of other benign neoplasm: Secondary | ICD-10-CM | POA: Diagnosis not present

## 2021-01-26 DIAGNOSIS — Z872 Personal history of diseases of the skin and subcutaneous tissue: Secondary | ICD-10-CM | POA: Diagnosis not present

## 2021-01-26 DIAGNOSIS — L570 Actinic keratosis: Secondary | ICD-10-CM | POA: Diagnosis not present

## 2021-01-26 DIAGNOSIS — L578 Other skin changes due to chronic exposure to nonionizing radiation: Secondary | ICD-10-CM | POA: Diagnosis not present

## 2021-04-01 ENCOUNTER — Encounter: Payer: Self-pay | Admitting: Nurse Practitioner

## 2021-04-01 ENCOUNTER — Other Ambulatory Visit: Payer: Self-pay

## 2021-04-01 ENCOUNTER — Ambulatory Visit (INDEPENDENT_AMBULATORY_CARE_PROVIDER_SITE_OTHER): Payer: PPO | Admitting: Nurse Practitioner

## 2021-04-01 VITALS — BP 118/68 | HR 50 | Temp 98.6°F | Wt 211.6 lb

## 2021-04-01 DIAGNOSIS — Z23 Encounter for immunization: Secondary | ICD-10-CM

## 2021-04-01 DIAGNOSIS — I700 Atherosclerosis of aorta: Secondary | ICD-10-CM

## 2021-04-01 DIAGNOSIS — E782 Mixed hyperlipidemia: Secondary | ICD-10-CM

## 2021-04-01 DIAGNOSIS — E559 Vitamin D deficiency, unspecified: Secondary | ICD-10-CM | POA: Diagnosis not present

## 2021-04-01 DIAGNOSIS — I77811 Abdominal aortic ectasia: Secondary | ICD-10-CM

## 2021-04-01 DIAGNOSIS — J432 Centrilobular emphysema: Secondary | ICD-10-CM | POA: Diagnosis not present

## 2021-04-01 DIAGNOSIS — Z1159 Encounter for screening for other viral diseases: Secondary | ICD-10-CM | POA: Diagnosis not present

## 2021-04-01 DIAGNOSIS — Z6830 Body mass index (BMI) 30.0-30.9, adult: Secondary | ICD-10-CM

## 2021-04-01 DIAGNOSIS — F17210 Nicotine dependence, cigarettes, uncomplicated: Secondary | ICD-10-CM

## 2021-04-01 DIAGNOSIS — E6609 Other obesity due to excess calories: Secondary | ICD-10-CM | POA: Diagnosis not present

## 2021-04-01 DIAGNOSIS — I10 Essential (primary) hypertension: Secondary | ICD-10-CM | POA: Diagnosis not present

## 2021-04-01 MED ORDER — ROSUVASTATIN CALCIUM 40 MG PO TABS: 40.0000 mg | ORAL_TABLET | Freq: Every day | ORAL | 4 refills | Status: DC

## 2021-04-01 MED ORDER — EPINEPHRINE 0.3 MG/0.3ML IJ SOAJ: 0.3000 mg | INTRAMUSCULAR | 4 refills | Status: AC | PRN

## 2021-04-01 NOTE — Progress Notes (Signed)
BP 118/68 (BP Location: Left Arm)   Pulse (!) 50   Temp 98.6 F (37 C) (Oral)   Wt 211 lb 9.6 oz (96 kg)   SpO2 95%   BMI 30.36 kg/m    Subjective:    Patient ID: Jim Bauer, male    DOB: April 04, 1951, 70 y.o.   MRN: [id number]  HPI: Jim Bauer is a 70 y.o. male  Chief Complaint  Patient presents with   AAA   Hypertension   Hyperlipidemia   COPD   HYPERTENSION / HYPERLIPIDEMIA Continues on Rosuvastatin 40 MG (swtiched to this in 2021, had been on Simvastatin 80 MG) and ASA, no current BP medications.   Did have AAA screening in December 2016 noting "Abdominal aortic ectasia 2.8 cm. Ectatic abdominal aorta at risk for aneurysm development. Recommend follow-up by ultrasound in 5 years".  To repeat in December 2021 -- did not obtain as of yet, although ordered. Satisfied with current treatment? yes Duration of hypertension: chronic BP monitoring frequency: daily BP range: 97/52 to 131/68 -- on average at goal <110/80 BP medication side effects: no Duration of hyperlipidemia: chronic Cholesterol medication side effects: no, Cholesterol supplements: fish oil Medication compliance: good compliance Aspirin: yes Recent stressors: no Recurrent headaches: no Visual changes: no Palpitations: no Dyspnea: no Chest pain: no Lower extremity edema: no Dizzy/lightheaded: no    VITAMIN D DEFICIENCY: Taking daily supplement.  No recent falls or fractures.  Recent Vitamin D level 36.6 and CA+ 9.8.  COPD CT lung screening noted mild centrilobular emphysema and aortic atherosclerosis on scan 10/24/19, continues to be noticed on yearly screening 11/01/20.  Smoking a little less, goes days without any.   COPD status: stable Satisfied with current treatment?: yes Oxygen use: no Dyspnea frequency: none Cough frequency: occasional Rescue inhaler frequency:  none Limitation of activity: no Productive cough: none Last Spirometry: 09/29/20 Pneumovax: Up to Date Influenza: Up to  Date   Relevant past medical, surgical, family and social history reviewed and updated as indicated. Interim medical history since our last visit reviewed. Allergies and medications reviewed and updated.  Review of Systems  Constitutional:  Negative for activity change, diaphoresis, fatigue and fever.  Respiratory:  Negative for cough, chest tightness, shortness of breath and wheezing.   Cardiovascular:  Negative for chest pain, palpitations and leg swelling.  Gastrointestinal: Negative.   Neurological: Negative.   Psychiatric/Behavioral: Negative.     Per HPI unless specifically indicated above     Objective:    BP 118/68 (BP Location: Left Arm)   Pulse (!) 50   Temp 98.6 F (37 C) (Oral)   Wt 211 lb 9.6 oz (96 kg)   SpO2 95%   BMI 30.36 kg/m   Wt Readings from Last 3 Encounters:  04/01/21 211 lb 9.6 oz (96 kg)  11/01/20 210 lb (95.3 kg)  09/29/20 210 lb 9.6 oz (95.5 kg)    Physical Exam Vitals and nursing note reviewed.  Constitutional:      General: He is awake. He is not in acute distress.    Appearance: He is well-developed, well-groomed and overweight. He is not ill-appearing.  HENT:     Head: Normocephalic and atraumatic.     Right Ear: Hearing normal. No drainage.     Left Ear: Hearing normal. No drainage.  Eyes:     General: Lids are normal.        Right eye: No discharge.        Left eye: No discharge.  Conjunctiva/sclera: Conjunctivae normal.     Pupils: Pupils are equal, round, and reactive to light.  Neck:     Thyroid: No thyromegaly.     Vascular: No carotid bruit.     Trachea: Trachea normal.  Cardiovascular:     Rate and Rhythm: Normal rate and regular rhythm.     Heart sounds: Normal heart sounds, S1 normal and S2 normal. No murmur heard.   No gallop.  Pulmonary:     Effort: Pulmonary effort is normal. No accessory muscle usage or respiratory distress.     Breath sounds: Normal breath sounds.  Abdominal:     General: Bowel sounds are  normal.     Palpations: Abdomen is soft. There is no hepatomegaly or splenomegaly.  Musculoskeletal:        General: Normal range of motion.     Cervical back: Normal range of motion and neck supple.     Right lower leg: No edema.     Left lower leg: No edema.  Skin:    General: Skin is warm and dry.     Capillary Refill: Capillary refill takes less than 2 seconds.  Neurological:     Mental Status: He is alert and oriented to person, place, and time.  Psychiatric:        Attention and Perception: Attention normal.        Mood and Affect: Mood normal.        Speech: Speech normal.        Behavior: Behavior normal. Behavior is cooperative.        Thought Content: Thought content normal.   Results for orders placed or performed in visit on 09/29/20  CBC with Differential/Platelet  Result Value Ref Range   WBC 5.8 3.4 - 10.8 x10E3/uL   RBC 5.27 4.14 - 5.80 x10E6/uL   Hemoglobin 15.7 13.0 - 17.7 g/dL   Hematocrit 47.1 37.5 - 51.0 %   MCV 89 79 - 97 fL   MCH 29.8 26.6 - 33.0 pg   MCHC 33.3 31.5 - 35.7 g/dL   RDW 13.6 11.6 - 15.4 %   Platelets 200 150 - 450 x10E3/uL   Neutrophils 57 Not Estab. %   Lymphs 34 Not Estab. %   Monocytes 8 Not Estab. %   Eos 1 Not Estab. %   Basos 0 Not Estab. %   Neutrophils Absolute 3.2 1.4 - 7.0 x10E3/uL   Lymphocytes Absolute 2.0 0.7 - 3.1 x10E3/uL   Monocytes Absolute 0.5 0.1 - 0.9 x10E3/uL   EOS (ABSOLUTE) 0.1 0.0 - 0.4 x10E3/uL   Basophils Absolute 0.0 0.0 - 0.2 x10E3/uL   Immature Granulocytes 0 Not Estab. %   Immature Grans (Abs) 0.0 0.0 - 0.1 x10E3/uL  PSA  Result Value Ref Range   Prostate Specific Ag, Serum 3.6 0.0 - 4.0 ng/mL  TSH  Result Value Ref Range   TSH 1.180 0.450 - 4.500 uIU/mL  Lipid Panel w/o Chol/HDL Ratio  Result Value Ref Range   Cholesterol, Total 161 100 - 199 mg/dL   Triglycerides 101 0 - 149 mg/dL   HDL 70 >39 mg/dL   VLDL Cholesterol Cal 18 5 - 40 mg/dL   LDL Chol Calc (NIH) 73 0 - 99 mg/dL  Basic metabolic  panel  Result Value Ref Range   Glucose 127 (H) 65 - 99 mg/dL   BUN 23 8 - 27 mg/dL   Creatinine, Ser 0.91 0.76 - 1.27 mg/dL   GFR calc non Af Amer 85 >59  mL/min/1.73   GFR calc Af Amer 98 >59 mL/min/1.73   BUN/Creatinine Ratio 25 (H) 10 - 24   Sodium 142 134 - 144 mmol/L   Potassium 4.4 3.5 - 5.2 mmol/L   Chloride 108 (H) 96 - 106 mmol/L   CO2 22 20 - 29 mmol/L   Calcium 9.9 8.6 - 10.2 mg/dL      Assessment & Plan:   Problem List Items Addressed This Visit       Cardiovascular and Mediastinum   Essential hypertension - Primary    Chronic, ongoing with elevation in BP today initially, repeat improved, but home readings significantly below goal at baseline.  Will continue focus on DASH diet at home and recommend complete smoking cessation.  Initiate medication as needed if home readings elevated.  He documents BP consistently, bringing flow sheets to visits. Return in 6 months.  Check CMP today.       Relevant Medications   rosuvastatin (CRESTOR) 40 MG tablet   EPINEPHrine 0.3 mg/0.3 mL IJ SOAJ injection   Other Relevant Orders   Comprehensive metabolic panel   Aortic atherosclerosis ([place])    Noted on Lung CT screening.  Recommend complete cessation of smoking.  Continue daily statin and ASA for prevention.       Relevant Medications   rosuvastatin (CRESTOR) 40 MG tablet   EPINEPHrine 0.3 mg/0.3 mL IJ SOAJ injection   Aortic ectasia, abdominal ([facility]CC)    Continue to monitor BP and ensure good control.  Recommend continue ASA and statin daily, adjust as needed + complete cessation smoking.  Repeat scan ordered and patient aware of need for this, if any changes send to vascular -- will check on order as he has not obtained yet nor heard to schedule.       Relevant Medications   rosuvastatin (CRESTOR) 40 MG tablet   EPINEPHrine 0.3 mg/0.3 mL IJ SOAJ injection     Respiratory   Centrilobular emphysema ([facility])    Noted on CT lung CA screening.  Spirometry in January 2022 =  FEV1 109% and FEV1/FVC 108% pre.  Currently no symptoms and no inhaler regimen.  Will plan to initiate inhaler regimen as needed and recommend complete cessation of smoking.  Continue yearly lung screening and spirometry.         Other   Vitamin D deficiency    Ongoing.  Continue supplement and recheck Vit D level today.  Adjust supplement as needed.       Relevant Orders   VITAMIN D 25 Hydroxy (Vit-D Deficiency, Fractures)   Hyperlipidemia    Chronic, ongoing.  Continue current medication regimen and adjust as needed.  Check lipid panel today, last LDL >70, may adjust statin dose if ongoing elevation.  Is fasting today.       Relevant Medications   rosuvastatin (CRESTOR) 40 MG tablet   EPINEPHrine 0.3 mg/0.3 mL IJ SOAJ injection   Other Relevant Orders   Lipid Panel w/o Chol/HDL Ratio   Nicotine dependence, cigarettes, uncomplicated    I have recommended complete cessation of tobacco use. I have discussed various options available for assistance with tobacco cessation including over the counter methods (Nicotine gum, patch and lozenges). We also discussed prescription options (Chantix, Nicotine Inhaler / Nasal Spray). The patient is not interested in pursuing any prescription tobacco cessation options at this time.        Obesity    BMI 30.36.  Recommended eating smaller high protein, low fat meals more frequently and exercising 30 mins  a day 5 times a week with a goal of 10-15lb weight loss in the next 3 months. Patient voiced their understanding and motivation to adhere to these recommendations.        Other Visit Diagnoses     Need for Td vaccine       TD vaccine today to update.   Relevant Orders   Td vaccine greater than or equal to 7yo preservative free IM   Need for hepatitis C screening test       Hep C screening on labs today per CDC guideline recommendations for one time screening, discussed with patient.   Relevant Orders   Hepatitis C antibody         Follow up plan: Return in about 6 months (around 10/02/2021) for HLD/HTN, Vit D, COPD -- spirometry needed.

## 2021-04-01 NOTE — Assessment & Plan Note (Signed)
Noted on CT lung CA screening.  Spirometry in January 2022 = FEV1 109% and FEV1/FVC 108% pre.  Currently no symptoms and no inhaler regimen.  Will plan to initiate inhaler regimen as needed and recommend complete cessation of smoking.  Continue yearly lung screening and spirometry.

## 2021-04-01 NOTE — Assessment & Plan Note (Signed)
Chronic, ongoing.  Continue current medication regimen and adjust as needed.  Check lipid panel today, last LDL >70, may adjust statin dose if ongoing elevation.  Is fasting today.

## 2021-04-01 NOTE — Assessment & Plan Note (Signed)
I have recommended complete cessation of tobacco use. I have discussed various options available for assistance with tobacco cessation including over the counter methods (Nicotine gum, patch and lozenges). We also discussed prescription options (Chantix, Nicotine Inhaler / Nasal Spray). The patient is not interested in pursuing any prescription tobacco cessation options at this time.  

## 2021-04-01 NOTE — Assessment & Plan Note (Signed)
Noted on Lung CT screening.  Recommend complete cessation of smoking.  Continue daily statin and ASA for prevention. 

## 2021-04-01 NOTE — Assessment & Plan Note (Signed)
Chronic, ongoing with elevation in BP today initially, repeat improved, but home readings significantly below goal at baseline.  Will continue focus on DASH diet at home and recommend complete smoking cessation.  Initiate medication as needed if home readings elevated.  He documents BP consistently, bringing flow sheets to visits. Return in 6 months.  Check CMP today.

## 2021-04-01 NOTE — Assessment & Plan Note (Signed)
Continue to monitor BP and ensure good control.  Recommend continue ASA and statin daily, adjust as needed + complete cessation smoking.  Repeat scan ordered and patient aware of need for this, if any changes send to vascular -- will check on order as he has not obtained yet nor heard to schedule.

## 2021-04-01 NOTE — Assessment & Plan Note (Signed)
Ongoing.  Continue supplement and recheck Vit D level today.  Adjust supplement as needed.

## 2021-04-01 NOTE — Assessment & Plan Note (Signed)
BMI 30.36.  Recommended eating smaller high protein, low fat meals more frequently and exercising 30 mins a day 5 times a week with a goal of 10-15lb weight loss in the next 3 months. Patient voiced their understanding and motivation to adhere to these recommendations.

## 2021-04-01 NOTE — Patient Instructions (Signed)
Managing the Challenge of Quitting Smoking Quitting smoking is a physical and mental challenge. You will face cravings, withdrawal symptoms, and temptation. Before quitting, work with your health care provider to make a plan that can help you manage quitting. Preparation canhelp you quit and keep you from giving in. How to manage lifestyle changes Managing stress Stress can make you want to smoke, and wanting to smoke may cause stress. It is important to find ways to manage your stress. You might try some of the following: Practice relaxation techniques. Breathe slowly and deeply, in through your nose and out through your mouth. Listen to music. Soak in a bath or take a shower. Imagine a peaceful place or vacation. Get some support. Talk with family or friends about your stress. Join a support group. Talk with a counselor or therapist. Get some physical activity. Go for a walk, run, or bike ride. Play a favorite sport. Practice yoga.  Medicines Talk with your health care provider about medicines that might help you dealwith cravings and make quitting easier for you. Relationships Social situations can be difficult when you are quitting smoking. To manage this, you can: Avoid parties and other social situations where people might be smoking. Avoid alcohol. Leave right away if you have the urge to smoke. Explain to your family and friends that you are quitting smoking. Ask for support and let them know you might be a bit grumpy. Plan activities where smoking is not an option. General instructions Be aware that many people gain weight after they quit smoking. However, not everyone does. To keep from gaining weight, have a plan in place before you quit and stick to the plan after you quit. Your plan should include: Having healthy snacks. When you have a craving, it may help to: Eat popcorn, carrots, celery, or other cut vegetables. Chew sugar-free gum. Changing how you eat. Eat small  portion sizes at meals. Eat 4-6 small meals throughout the day instead of 1-2 large meals a day. Be mindful when you eat. Do not watch television or do other things that might distract you as you eat. Exercising regularly. Make time to exercise each day. If you do not have time for a long workout, do short bouts of exercise for 5-10 minutes several times a day. Do some form of strengthening exercise, such as weight lifting. Do some exercise that gets your heart beating and causes you to breathe deeply, such as walking fast, running, swimming, or biking. This is very important. Drinking plenty of water or other low-calorie or no-calorie drinks. Drink 6-8 glasses of water daily.  How to recognize withdrawal symptoms Your body and mind may experience discomfort as you try to get used to not having nicotine in your system. These effects are called withdrawal symptoms. They may include: Feeling hungrier than normal. Having trouble concentrating. Feeling irritable or restless. Having trouble sleeping. Feeling depressed. Craving a cigarette. To manage withdrawal symptoms: Avoid places, people, and activities that trigger your cravings. Remember why you want to quit. Get plenty of sleep. Avoid coffee and other caffeinated drinks. These may worsen some of your symptoms. These symptoms may surprise you. But be assured that they are normal to havewhen quitting smoking. How to manage cravings Come up with a plan for how to deal with your cravings. The plan should include the following: A definition of the specific situation you want to deal with. An alternative action you will take. A clear idea for how this action will help. The   name of someone who might help you with this. Cravings usually last for 5-10 minutes. Consider taking the following actions to help you with your plan to deal with cravings: Keep your mouth busy. Chew sugar-free gum. Suck on hard candies or a straw. Brush your  teeth. Keep your hands and body busy. Change to a different activity right away. Squeeze or play with a ball. Do an activity or a hobby, such as making bead jewelry, practicing needlepoint, or working with wood. Mix up your normal routine. Take a short exercise break. Go for a quick walk or run up and down stairs. Focus on doing something kind or helpful for someone else. Call a friend or family member to talk during a craving. Join a support group. Contact a quitline. Where to find support To get help or find a support group: Call the National Cancer Institute's Smoking Quitline: 1-800-QUIT NOW (784-8669) Visit the website of the Substance Abuse and Mental Health Services Administration: www.samhsa.gov Text QUIT to SmokefreeTXT: 478848 Where to find more information Visit these websites to find more information on quitting smoking: National Cancer Institute: www.smokefree.gov American Lung Association: www.lung.org American Cancer Society: www.cancer.org Centers for Disease Control and Prevention: www.cdc.gov American Heart Association: www.heart.org Contact a health care provider if: You want to change your plan for quitting. The medicines you are taking are not helping. Your eating feels out of control or you cannot sleep. Get help right away if: You feel depressed or become very anxious. Summary Quitting smoking is a physical and mental challenge. You will face cravings, withdrawal symptoms, and temptation to smoke again. Preparation can help you as you go through these challenges. Try different techniques to manage stress, handle social situations, and prevent weight gain. You can deal with cravings by keeping your mouth busy (such as by chewing gum), keeping your hands and body busy, calling family or friends, or contacting a quitline for people who want to quit smoking. You can deal with withdrawal symptoms by avoiding places where people smoke, getting plenty of rest, and  avoiding drinks with caffeine. This information is not intended to replace advice given to you by your health care provider. Make sure you discuss any questions you have with your healthcare provider. Document Revised: 06/24/2019 Document Reviewed: 06/24/2019 Elsevier Patient Education  2022 Elsevier Inc.  

## 2021-04-02 LAB — VITAMIN D 25 HYDROXY (VIT D DEFICIENCY, FRACTURES): Vit D, 25-Hydroxy: 37.3 ng/mL (ref 30.0–100.0)

## 2021-04-02 LAB — COMPREHENSIVE METABOLIC PANEL
ALT: 27 IU/L (ref 0–44)
AST: 21 IU/L (ref 0–40)
Albumin/Globulin Ratio: 2.3 — ABNORMAL HIGH (ref 1.2–2.2)
Albumin: 4.4 g/dL (ref 3.8–4.8)
Alkaline Phosphatase: 82 IU/L (ref 44–121)
BUN/Creatinine Ratio: 22 (ref 10–24)
BUN: 19 mg/dL (ref 8–27)
Bilirubin Total: 0.4 mg/dL (ref 0.0–1.2)
CO2: 23 mmol/L (ref 20–29)
Calcium: 9.4 mg/dL (ref 8.6–10.2)
Chloride: 107 mmol/L — ABNORMAL HIGH (ref 96–106)
Creatinine, Ser: 0.88 mg/dL (ref 0.76–1.27)
Globulin, Total: 1.9 g/dL (ref 1.5–4.5)
Glucose: 102 mg/dL — ABNORMAL HIGH (ref 65–99)
Potassium: 4.8 mmol/L (ref 3.5–5.2)
Sodium: 145 mmol/L — ABNORMAL HIGH (ref 134–144)
Total Protein: 6.3 g/dL (ref 6.0–8.5)
eGFR: 93 mL/min/{1.73_m2} (ref >59)

## 2021-04-02 LAB — LIPID PANEL W/O CHOL/HDL RATIO
Cholesterol, Total: 174 mg/dL (ref 100–199)
HDL: 77 mg/dL (ref >39)
LDL Chol Calc (NIH): 79 mg/dL (ref 0–99)
Triglycerides: 102 mg/dL (ref 0–149)
VLDL Cholesterol Cal: 18 mg/dL (ref 5–40)

## 2021-04-02 LAB — HEPATITIS C ANTIBODY: Hep C Virus Ab: <0.1 s/co ratio (ref 0.0–0.9)

## 2021-04-02 NOTE — Progress Notes (Signed)
Contacted via [place] morning [name], your labs have returned.  Overall they continue to be stable.  Continue Rosuvastatin as this is maintaining good control of cholesterol levels.  Vitamin D is normal.  Hep C is negative.  Sodium, salt, and chloride are mildly elevated + glucose.  I would recommend increasing your water intake daily, especially when outside doing activity.  We will recheck next visit.  Any questions? Keep being awesome!!  Thank you for allowing me to participate in your care.  I appreciate you. Kindest regards, Ranell Skibinski

## 2021-04-25 ENCOUNTER — Other Ambulatory Visit: Payer: Self-pay

## 2021-04-25 ENCOUNTER — Ambulatory Visit
Admission: RE | Admit: 2021-04-25 | Discharge: 2021-04-25 | Disposition: A | Discharge status: Home / Self Care | Payer: PPO | Source: Ambulatory Visit | Attending: Nurse Practitioner | Admitting: Nurse Practitioner

## 2021-04-25 DIAGNOSIS — I77819 Aortic ectasia, unspecified site: Secondary | ICD-10-CM | POA: Diagnosis not present

## 2021-04-25 DIAGNOSIS — I77811 Abdominal aortic ectasia: Secondary | ICD-10-CM | POA: Diagnosis not present

## 2021-04-25 NOTE — Progress Notes (Signed)
Contacted via MyChart   Good evening Jim Bauer, your ultrasound continues to show some mild bulging of aorta, similar in size to last check with recommendation to repeat in 5 years.  Continue on Rosuvastatin for prevention and Aspirin.  Any questions? Keep being awesome!!  Thank you for allowing me to participate in your care.  I appreciate you. Kindest regards, Kannen Moxey

## 2021-09-07 ENCOUNTER — Ambulatory Visit: Payer: PPO

## 2021-09-29 ENCOUNTER — Ambulatory Visit: Payer: PPO

## 2021-09-30 ENCOUNTER — Ambulatory Visit: Payer: PPO

## 2021-10-10 ENCOUNTER — Other Ambulatory Visit: Payer: Self-pay

## 2021-10-10 ENCOUNTER — Encounter: Payer: Self-pay | Admitting: Nurse Practitioner

## 2021-10-10 ENCOUNTER — Ambulatory Visit (INDEPENDENT_AMBULATORY_CARE_PROVIDER_SITE_OTHER): Payer: PPO | Admitting: Nurse Practitioner

## 2021-10-10 VITALS — BP 136/82 | HR 71 | Temp 98.5°F | Ht 70.0 in | Wt 210.0 lb

## 2021-10-10 DIAGNOSIS — F17210 Nicotine dependence, cigarettes, uncomplicated: Secondary | ICD-10-CM

## 2021-10-10 DIAGNOSIS — J432 Centrilobular emphysema: Secondary | ICD-10-CM

## 2021-10-10 DIAGNOSIS — E782 Mixed hyperlipidemia: Secondary | ICD-10-CM

## 2021-10-10 DIAGNOSIS — I700 Atherosclerosis of aorta: Secondary | ICD-10-CM | POA: Diagnosis not present

## 2021-10-10 DIAGNOSIS — Z6830 Body mass index (BMI) 30.0-30.9, adult: Secondary | ICD-10-CM | POA: Diagnosis not present

## 2021-10-10 DIAGNOSIS — I10 Essential (primary) hypertension: Secondary | ICD-10-CM | POA: Diagnosis not present

## 2021-10-10 DIAGNOSIS — R7303 Prediabetes: Secondary | ICD-10-CM | POA: Diagnosis not present

## 2021-10-10 DIAGNOSIS — R918 Other nonspecific abnormal finding of lung field: Secondary | ICD-10-CM

## 2021-10-10 DIAGNOSIS — I77811 Abdominal aortic ectasia: Secondary | ICD-10-CM | POA: Diagnosis not present

## 2021-10-10 DIAGNOSIS — E6609 Other obesity due to excess calories: Secondary | ICD-10-CM | POA: Diagnosis not present

## 2021-10-10 DIAGNOSIS — E559 Vitamin D deficiency, unspecified: Secondary | ICD-10-CM

## 2021-10-10 DIAGNOSIS — L720 Epidermal cyst: Secondary | ICD-10-CM

## 2021-10-10 LAB — MICROALBUMIN, URINE WAIVED
Creatinine, Urine Waived: 300 mg/dL (ref 10–300)
Microalb, Ur Waived: 30 mg/L — ABNORMAL HIGH (ref 0–19)
Microalb/Creat Ratio: <30 mg/g (ref <30)

## 2021-10-10 LAB — BAYER DCA HB A1C WAIVED: HB A1C (BAYER DCA - WAIVED): 5.6 % (ref 4.8–5.6)

## 2021-10-10 NOTE — Assessment & Plan Note (Signed)
Noted on Lung CT screening.  Recommend complete cessation of smoking.  Continue daily statin and ASA for prevention. 

## 2021-10-10 NOTE — Assessment & Plan Note (Signed)
Today A1c 5.6% and urine ALB 30.  Continue diet focus and consider ARB initiation in future.

## 2021-10-10 NOTE — Assessment & Plan Note (Signed)
Followed yearly by nodule clinic.  Continue to monitor.  Recommend complete smoking cessation. 

## 2021-10-10 NOTE — Assessment & Plan Note (Signed)
Continue to monitor BP and ensure good control.  Recommend continue ASA and statin daily, adjust as needed + complete cessation smoking.  Repeat in 2027.

## 2021-10-10 NOTE — Assessment & Plan Note (Signed)
Chronic, ongoing with elevation in BP today initially, repeat improved, but home readings significantly below goal at baseline -- he documents daily and brings to all visits, will scan into chart.  Will continue focus on DASH diet at home and recommend complete smoking cessation.  Initiate medication as needed if home readings elevated -- consider ARB due to urine ALB 30.  He documents BP consistently, bringing flow sheets to visits. Return in 6 months.  Check CMP, CBC, TSH today.

## 2021-10-10 NOTE — Assessment & Plan Note (Signed)
BMI 30.13.  Recommended eating smaller high protein, low fat meals more frequently and exercising 30 mins a day 5 times a week with a goal of 10-15lb weight loss in the next 3 months. Patient voiced their understanding and motivation to adhere to these recommendations.

## 2021-10-10 NOTE — Assessment & Plan Note (Signed)
Noted on CT lung CA screening.  Spirometry in today remains stable FEV1 104% and FEV1/FVC 109% compared to January 2022 = FEV1 109% and FEV1/FVC 108% pre.  Currently no symptoms and no inhaler regimen.  Will plan to initiate inhaler regimen as needed and recommend complete cessation of smoking.  Continue yearly lung screening and spirometry.

## 2021-10-10 NOTE — Progress Notes (Signed)
BP 136/82 (BP Location: Left Arm, Patient Position: Sitting)    Pulse 71    Temp 98.5 F (36.9 C) (Oral)    Ht 5' 10" (1.778 m)    Wt 210 lb (95.3 kg)    SpO2 96%    BMI 30.13 kg/m    Subjective:    Patient ID: Jim Bauer, male    DOB: [birth year], 71 y.o.   MRN: [id number]  HPI: Jim Bauer is a 71 y.o. male  Chief Complaint  Patient presents with   Hypertension   Hyperlipidemia   Vitamin D    COPD   Cyst    Patient states he has a spot on his R index finger that he would like for the provider to take a look at. Patient states he thinks it may be cyst or splinter in his finger.    HYPERTENSION / HYPERLIPIDEMIA Continues on Rosuvastatin 40 MG and ASA, no current BP medications.  Recent labs showed mild elevation in glucose.   AAA screening in December 2016 noting "Abdominal aortic ectasia 2.8 cm. Ectatic abdominal aorta at risk for aneurysm development. Recommend follow-up by ultrasound in 5 years".  Repeat on 04/25/2021 noted ongoing measurement of 2.8 cm to continue 5 year follow-up.   Satisfied with current treatment? yes Duration of hypertension: chronic BP monitoring frequency: daily BP range: 91/49 to 127/67 -- on average at goal <110/80 --HR 50-60 on average BP medication side effects: no Duration of hyperlipidemia: chronic Cholesterol medication side effects: no, Cholesterol supplements: fish oil Medication compliance: good compliance Aspirin: yes Recent stressors: no Recurrent headaches: no Visual changes: no Palpitations: no Dyspnea: no Chest pain: no Lower extremity edema: no Dizzy/lightheaded: no   COPD CT lung screening noted mild centrilobular emphysema and aortic atherosclerosis on scan 10/24/19, continues to be noticed on yearly screening 11/01/20.  Smoking 1/2 PPD at this time, has cut back from past 1 PPD or more. Has smoked since age 55. COPD status: stable Satisfied with current treatment?: yes Oxygen use: no Dyspnea frequency: none Cough  frequency: occasional, more in morning Rescue inhaler frequency:  none at this time Limitation of activity: no Productive cough: none Last Spirometry: 10/10/21 Pneumovax: Up to Date Influenza: Up to Date  VITAMIN D DEFICIENCY: Taking daily supplement.  No recent falls or fractures.  Recent Vitamin D level 37.3 and CA+ 9.4.  SKIN LESION To right palmar aspect of finger near PIP, not causing any pain. Duration: months Location: as above Painful: no Itching: no Onset: gradual Context: not changing Associated signs and symptoms: none History of skin cancer: no History of precancerous skin lesions: no Family history of skin cancer: no   Depression screen [facility] 2/9 10/10/2021 09/29/2020 09/27/2020 09/17/2019 09/17/2019  Decreased Interest 0 0 0 0 0  Down, Depressed, Hopeless 0 0 0 0 0  PHQ - 2 Score 0 0 0 0 0  Altered sleeping 0 - - 0 -  Tired, decreased energy 1 - - 0 -  Change in appetite 0 - - 0 -  Feeling bad or failure about yourself  0 - - 0 -  Trouble concentrating 0 - - 0 -  Moving slowly or fidgety/restless 0 - - 0 -  Suicidal thoughts 0 - - 0 -  PHQ-9 Score 1 - - 0 -  Difficult doing work/chores Not difficult at all - - - -      Relevant past medical, surgical, family and social history reviewed and updated as indicated. Interim medical history  since our last visit reviewed. Allergies and medications reviewed and updated.  Review of Systems  Constitutional:  Negative for activity change, diaphoresis, fatigue and fever.  Respiratory:  Negative for cough, chest tightness, shortness of breath and wheezing.   Cardiovascular:  Negative for chest pain, palpitations and leg swelling.  Gastrointestinal: Negative.   Neurological: Negative.   Psychiatric/Behavioral: Negative.     Per HPI unless specifically indicated above     Objective:    BP 136/82 (BP Location: Left Arm, Patient Position: Sitting)    Pulse 71    Temp 98.5 F (36.9 C) (Oral)    Ht 5' 10" (1.778 m)    Wt  210 lb (95.3 kg)    SpO2 96%    BMI 30.13 kg/m   Wt Readings from Last 3 Encounters:  10/10/21 210 lb (95.3 kg)  04/01/21 211 lb 9.6 oz (96 kg)  11/01/20 210 lb (95.3 kg)    Physical Exam Vitals and nursing note reviewed.  Constitutional:      General: He is awake. He is not in acute distress.    Appearance: He is well-developed, well-groomed and overweight. He is not ill-appearing.  HENT:     Head: Normocephalic and atraumatic.     Right Ear: Hearing normal. No drainage.     Left Ear: Hearing normal. No drainage.  Eyes:     General: Lids are normal.        Right eye: No discharge.        Left eye: No discharge.     Conjunctiva/sclera: Conjunctivae normal.     Pupils: Pupils are equal, round, and reactive to light.  Neck:     Thyroid: No thyromegaly.     Vascular: No carotid bruit.     Trachea: Trachea normal.  Cardiovascular:     Rate and Rhythm: Normal rate and regular rhythm.     Heart sounds: Normal heart sounds, S1 normal and S2 normal. No murmur heard.   No gallop.  Pulmonary:     Effort: Pulmonary effort is normal. No accessory muscle usage or respiratory distress.     Breath sounds: Normal breath sounds.  Abdominal:     General: Bowel sounds are normal.     Palpations: Abdomen is soft. There is no hepatomegaly or splenomegaly.  Musculoskeletal:        General: Normal range of motion.     Cervical back: Normal range of motion and neck supple.     Right lower leg: No edema.     Left lower leg: No edema.  Skin:    General: Skin is warm and dry.     Capillary Refill: Capillary refill takes less than 2 seconds.     Comments: Small <1/2 cm, firm cystic like mass that is mobile to inner, palmar aspect of right index finger above PIP.  No tenderness or erythema.  Neurological:     Mental Status: He is alert and oriented to person, place, and time.  Psychiatric:        Attention and Perception: Attention normal.        Mood and Affect: Mood normal.        Speech:  Speech normal.        Behavior: Behavior normal. Behavior is cooperative.        Thought Content: Thought content normal.   Results for orders placed or performed in visit on 04/01/21  Comprehensive metabolic panel  Result Value Ref Range   Glucose 102 (H) 65 -  99 mg/dL   BUN 19 8 - 27 mg/dL   Creatinine, Ser 0.88 0.76 - 1.27 mg/dL   eGFR 93 >59 mL/min/1.73   BUN/Creatinine Ratio 22 10 - 24   Sodium 145 (H) 134 - 144 mmol/L   Potassium 4.8 3.5 - 5.2 mmol/L   Chloride 107 (H) 96 - 106 mmol/L   CO2 23 20 - 29 mmol/L   Calcium 9.4 8.6 - 10.2 mg/dL   Total Protein 6.3 6.0 - 8.5 g/dL   Albumin 4.4 3.8 - 4.8 g/dL   Globulin, Total 1.9 1.5 - 4.5 g/dL   Albumin/Globulin Ratio 2.3 (H) 1.2 - 2.2   Bilirubin Total 0.4 0.0 - 1.2 mg/dL   Alkaline Phosphatase 82 44 - 121 IU/L   AST 21 0 - 40 IU/L   ALT 27 0 - 44 IU/L  Lipid Panel w/o Chol/HDL Ratio  Result Value Ref Range   Cholesterol, Total 174 100 - 199 mg/dL   Triglycerides 102 0 - 149 mg/dL   HDL 77 >39 mg/dL   VLDL Cholesterol Cal 18 5 - 40 mg/dL   LDL Chol Calc (NIH) 79 0 - 99 mg/dL  VITAMIN D 25 Hydroxy (Vit-D Deficiency, Fractures)  Result Value Ref Range   Vit D, 25-Hydroxy 37.3 30.0 - 100.0 ng/mL  Hepatitis C antibody  Result Value Ref Range   Hep C Virus Ab <0.1 0.0 - 0.9 s/co ratio      Assessment & Plan:   Problem List Items Addressed This Visit       Cardiovascular and Mediastinum   Aortic atherosclerosis ([place])    Noted on Lung CT screening.  Recommend complete cessation of smoking.  Continue daily statin and ASA for prevention.      Aortic ectasia, abdominal ([facility])    Continue to monitor BP and ensure good control.  Recommend continue ASA and statin daily, adjust as needed + complete cessation smoking.  Repeat in 2027.      Essential hypertension    Chronic, ongoing with elevation in BP today initially, repeat improved, but home readings significantly below goal at baseline -- he documents daily and brings to  all visits, will scan into chart.  Will continue focus on DASH diet at home and recommend complete smoking cessation.  Initiate medication as needed if home readings elevated -- consider ARB due to urine ALB 30.  He documents BP consistently, bringing flow sheets to visits. Return in 6 months.  Check CMP, CBC, TSH today.      Relevant Orders   Basic metabolic panel   CBC with Differential/Platelet   TSH   Microalbumin, Urine Waived     Respiratory   Centrilobular emphysema ([place]) - Primary    Noted on CT lung CA screening.  Spirometry in today remains stable FEV1 104% and FEV1/FVC 109% compared to January 2022 = FEV1 109% and FEV1/FVC 108% pre.  Currently no symptoms and no inhaler regimen.  Will plan to initiate inhaler regimen as needed and recommend complete cessation of smoking.  Continue yearly lung screening and spirometry.      Relevant Orders   CBC with Differential/Platelet   Spirometry with graph (Completed)     Musculoskeletal and Integument   Epidermoid cyst of finger    Small to right index finger with no symptoms, monitor and send to general surgery as needed if increased size of symptoms.        Other   Hyperlipidemia    Chronic, ongoing.  Continue current medication regimen  and adjust as needed.  Check lipid panel today, last LDL >70, may adjust statin dose if ongoing elevation. Is not fasting today.      Relevant Orders   Lipid Panel w/o Chol/HDL Ratio   Nicotine dependence, cigarettes, uncomplicated    I have recommended complete cessation of tobacco use. I have discussed various options available for assistance with tobacco cessation including over the counter methods (Nicotine gum, patch and lozenges). We also discussed prescription options (Chantix, Nicotine Inhaler / Nasal Spray). The patient is not interested in pursuing any prescription tobacco cessation options at this time.       Obesity    BMI 30.13.  Recommended eating smaller high protein, low fat  meals more frequently and exercising 30 mins a day 5 times a week with a goal of 10-15lb weight loss in the next 3 months. Patient voiced their understanding and motivation to adhere to these recommendations.       Prediabetes    Today A1c 5.6% and urine ALB 30.  Continue diet focus and consider ARB initiation in future.      Relevant Orders   Microalbumin, Urine Waived   Bayer DCA Hb A1c Waived   Pulmonary nodules    Followed yearly by nodule clinic.  Continue to monitor.  Recommend complete smoking cessation.      Vitamin D deficiency    Ongoing.  Continue supplement and recheck Vit D level today.  Adjust supplement as needed.        Follow up plan: Return in about 6 months (around 04/09/2022) for HTN/HLD, COPD, VIT D.

## 2021-10-10 NOTE — Assessment & Plan Note (Signed)
Ongoing.  Continue supplement and recheck Vit D level today.  Adjust supplement as needed.

## 2021-10-10 NOTE — Assessment & Plan Note (Signed)
Small to right index finger with no symptoms, monitor and send to general surgery as needed if increased size of symptoms.

## 2021-10-10 NOTE — Assessment & Plan Note (Signed)
I have recommended complete cessation of tobacco use. I have discussed various options available for assistance with tobacco cessation including over the counter methods (Nicotine gum, patch and lozenges). We also discussed prescription options (Chantix, Nicotine Inhaler / Nasal Spray). The patient is not interested in pursuing any prescription tobacco cessation options at this time.  

## 2021-10-10 NOTE — Patient Instructions (Signed)

## 2021-10-10 NOTE — Assessment & Plan Note (Signed)
Chronic, ongoing.  Continue current medication regimen and adjust as needed.  Check lipid panel today, last LDL >70, may adjust statin dose if ongoing elevation. Is not fasting today.

## 2021-10-11 LAB — BASIC METABOLIC PANEL
BUN/Creatinine Ratio: 22 (ref 10–24)
BUN: 22 mg/dL (ref 8–27)
CO2: 24 mmol/L (ref 20–29)
Calcium: 10 mg/dL (ref 8.6–10.2)
Chloride: 107 mmol/L — ABNORMAL HIGH (ref 96–106)
Creatinine, Ser: 0.98 mg/dL (ref 0.76–1.27)
Glucose: 103 mg/dL — ABNORMAL HIGH (ref 70–99)
Potassium: 4.6 mmol/L (ref 3.5–5.2)
Sodium: 142 mmol/L (ref 134–144)
eGFR: 82 mL/min/{1.73_m2} (ref >59)

## 2021-10-11 LAB — CBC WITH DIFFERENTIAL/PLATELET
Basophils Absolute: 0 10*3/uL (ref 0.0–0.2)
Basos: 0 %
EOS (ABSOLUTE): 0.1 10*3/uL (ref 0.0–0.4)
Eos: 1 %
Hematocrit: 46.4 % (ref 37.5–51.0)
Hemoglobin: 15.9 g/dL (ref 13.0–17.7)
Immature Grans (Abs): 0 10*3/uL (ref 0.0–0.1)
Immature Granulocytes: 0 %
Lymphocytes Absolute: 2 10*3/uL (ref 0.7–3.1)
Lymphs: 35 %
MCH: 30.6 pg (ref 26.6–33.0)
MCHC: 34.3 g/dL (ref 31.5–35.7)
MCV: 89 fL (ref 79–97)
Monocytes Absolute: 0.5 10*3/uL (ref 0.1–0.9)
Monocytes: 9 %
Neutrophils Absolute: 3.1 10*3/uL (ref 1.4–7.0)
Neutrophils: 55 %
Platelets: 188 10*3/uL (ref 150–450)
RBC: 5.19 x10E6/uL (ref 4.14–5.80)
RDW: 13.9 % (ref 11.6–15.4)
WBC: 5.8 10*3/uL (ref 3.4–10.8)

## 2021-10-11 LAB — LIPID PANEL W/O CHOL/HDL RATIO
Cholesterol, Total: 153 mg/dL (ref 100–199)
HDL: 74 mg/dL (ref >39)
LDL Chol Calc (NIH): 62 mg/dL (ref 0–99)
Triglycerides: 90 mg/dL (ref 0–149)
VLDL Cholesterol Cal: 17 mg/dL (ref 5–40)

## 2021-10-11 LAB — TSH: TSH: 1.57 u[IU]/mL (ref 0.450–4.500)

## 2021-10-11 NOTE — Progress Notes (Signed)
Contacted via [place], your labs have returned and overall remain nice and stable.  I would continue all current medications, no changes needed.  Hope the golf game was good:) Keep being amazing!!  Thank you for allowing me to participate in your care.  I appreciate you. Kindest regards, Ruari Duggan

## 2021-10-17 DIAGNOSIS — H2513 Age-related nuclear cataract, bilateral: Secondary | ICD-10-CM | POA: Diagnosis not present

## 2021-10-31 ENCOUNTER — Other Ambulatory Visit: Payer: Self-pay | Admitting: *Deleted

## 2021-10-31 DIAGNOSIS — Z87891 Personal history of nicotine dependence: Secondary | ICD-10-CM

## 2021-10-31 DIAGNOSIS — F17210 Nicotine dependence, cigarettes, uncomplicated: Secondary | ICD-10-CM

## 2021-11-18 ENCOUNTER — Other Ambulatory Visit: Payer: Self-pay

## 2021-11-18 ENCOUNTER — Ambulatory Visit
Admission: RE | Admit: 2021-11-18 | Discharge: 2021-11-18 | Disposition: A | Discharge status: Home / Self Care | Payer: PPO | Source: Ambulatory Visit | Attending: Acute Care | Admitting: Acute Care

## 2021-11-18 DIAGNOSIS — F17210 Nicotine dependence, cigarettes, uncomplicated: Secondary | ICD-10-CM | POA: Diagnosis not present

## 2021-11-18 DIAGNOSIS — Z87891 Personal history of nicotine dependence: Secondary | ICD-10-CM | POA: Diagnosis not present

## 2021-11-22 ENCOUNTER — Other Ambulatory Visit: Payer: Self-pay | Admitting: Acute Care

## 2021-11-22 DIAGNOSIS — F17210 Nicotine dependence, cigarettes, uncomplicated: Secondary | ICD-10-CM

## 2021-11-22 DIAGNOSIS — Z87891 Personal history of nicotine dependence: Secondary | ICD-10-CM

## 2022-01-25 ENCOUNTER — Other Ambulatory Visit: Payer: Self-pay

## 2022-01-25 DIAGNOSIS — Z86010 Personal history of colonic polyps: Secondary | ICD-10-CM

## 2022-01-25 MED ORDER — NA SULFATE-K SULFATE-MG SULF 17.5-3.13-1.6 GM/177ML PO SOLN: 1.0000 | Freq: Once | ORAL | 0 refills | Status: AC

## 2022-01-25 NOTE — Progress Notes (Signed)
Gastroenterology Pre-Procedure Review ? ?Request Date: 05/15/2022 ?Requesting Physician: Dr. Vicente Males ? ? ?PATIENT REVIEW QUESTIONS: The patient responded to the following health history questions as indicated:   ? ?1. Are you having any GI issues? NO ?2. Do you have a personal history of Polyps? yes (LAST COLONOSCOPY ) ?3. Do you have a family history of Colon Cancer or Polyps? no ?4. Diabetes Mellitus? no ?5. Joint replacements in the past 12 months?no ?6. Major health problems in the past 3 months?no ?7. Any artificial heart valves, MVP, or defibrillator?no ?   ?MEDICATIONS & ALLERGIES:    ?Patient reports the following regarding taking any anticoagulation/antiplatelet therapy:   ?Plavix, Coumadin, Eliquis, Xarelto, Lovenox, Pradaxa, Brilinta, or Effient? no ?Aspirin? yes (81 MG) ? ?Patient confirms/reports the following medications:  ?Current Outpatient Medications  ?Medication Sig Dispense Refill  ? aspirin EC 81 MG tablet Take 81 mg by mouth daily.    ? Cholecalciferol (D2000 ULTRA STRENGTH) 50 MCG (2000 UT) CAPS Take 2,000 Units by mouth daily.    ? EPINEPHrine 0.3 mg/0.3 mL IJ SOAJ injection Inject 0.3 mg into the muscle as needed for anaphylaxis. 1 each 4  ? Omega 3 1000 MG CAPS Take 1 capsule by mouth daily.    ? rosuvastatin (CRESTOR) 40 MG tablet Take 1 tablet (40 mg total) by mouth daily. 90 tablet 4  ? ?No current facility-administered medications for this visit.  ? ? ?Patient confirms/reports the following allergies:  ?Allergies  ?Allergen Reactions  ? Bee Venom Swelling  ? ? ?Orders Placed This Encounter  ?Procedures  ? Ambulatory referral to Gastroenterology  ?  Referral Priority:   Routine  ?  Referral Type:   Consultation  ?  Referral Reason:   Specialty Services Required  ?  Referred to Provider:   Jonathon Bellows, MD  ?  Number of Visits Requested:   1  ? ? ?AUTHORIZATION INFORMATION ?Primary Insurance: ?1D#: ?Group #: ? ?Secondary Insurance: ?1D#: ?Group #: ? ?SCHEDULE INFORMATION: ?Date:  05/15/2022 ?Time: ?Location: [facility] ?

## 2022-01-30 DIAGNOSIS — L918 Other hypertrophic disorders of the skin: Secondary | ICD-10-CM | POA: Diagnosis not present

## 2022-01-30 DIAGNOSIS — L821 Other seborrheic keratosis: Secondary | ICD-10-CM | POA: Diagnosis not present

## 2022-01-30 DIAGNOSIS — L578 Other skin changes due to chronic exposure to nonionizing radiation: Secondary | ICD-10-CM | POA: Diagnosis not present

## 2022-01-30 DIAGNOSIS — Z86018 Personal history of other benign neoplasm: Secondary | ICD-10-CM | POA: Diagnosis not present

## 2022-01-30 DIAGNOSIS — L570 Actinic keratosis: Secondary | ICD-10-CM | POA: Diagnosis not present

## 2022-04-08 NOTE — Patient Instructions (Signed)

## 2022-04-10 ENCOUNTER — Encounter: Payer: Self-pay | Admitting: Nurse Practitioner

## 2022-04-10 ENCOUNTER — Ambulatory Visit (INDEPENDENT_AMBULATORY_CARE_PROVIDER_SITE_OTHER): Payer: PPO | Admitting: Nurse Practitioner

## 2022-04-10 VITALS — BP 130/76 | HR 54 | Temp 98.0°F | Ht 69.5 in | Wt 209.4 lb

## 2022-04-10 DIAGNOSIS — J432 Centrilobular emphysema: Secondary | ICD-10-CM | POA: Diagnosis not present

## 2022-04-10 DIAGNOSIS — Z6830 Body mass index (BMI) 30.0-30.9, adult: Secondary | ICD-10-CM

## 2022-04-10 DIAGNOSIS — I77811 Abdominal aortic ectasia: Secondary | ICD-10-CM

## 2022-04-10 DIAGNOSIS — E782 Mixed hyperlipidemia: Secondary | ICD-10-CM | POA: Diagnosis not present

## 2022-04-10 DIAGNOSIS — F17210 Nicotine dependence, cigarettes, uncomplicated: Secondary | ICD-10-CM

## 2022-04-10 DIAGNOSIS — I10 Essential (primary) hypertension: Secondary | ICD-10-CM | POA: Diagnosis not present

## 2022-04-10 DIAGNOSIS — I700 Atherosclerosis of aorta: Secondary | ICD-10-CM

## 2022-04-10 DIAGNOSIS — E559 Vitamin D deficiency, unspecified: Secondary | ICD-10-CM | POA: Diagnosis not present

## 2022-04-10 DIAGNOSIS — E6609 Other obesity due to excess calories: Secondary | ICD-10-CM | POA: Diagnosis not present

## 2022-04-10 MED ORDER — ROSUVASTATIN CALCIUM 40 MG PO TABS
40.0000 mg | ORAL_TABLET | Freq: Every day | ORAL | 4 refills | Status: DC
Start: 2022-04-10 — End: 2023-04-18

## 2022-04-10 NOTE — Progress Notes (Signed)
BP 130/76 (BP Location: Left Arm, Cuff Size: Normal)   Pulse (!) 54   Temp 98 F (36.7 C) (Oral)   Ht 5' 9.5" (1.765 m)   Wt 209 lb 6.4 oz (95 kg)   SpO2 95%   BMI 30.48 kg/m    Subjective:    Patient ID: Jim Bauer, male    DOB: [birth year], 71 y.o.   MRN: [id number]  HPI: Jim Bauer is a 71 y.o. male  Chief Complaint  Patient presents with   COPD   Hyperlipidemia   Hypertension   Vitamin D   Medication Refill    Patient is requesting a refill on Rosuvastatin prescription.    HYPERTENSION / HYPERLIPIDEMIA Taking Rosuvastatin 40 MG and ASA, no current BP medications.     AAA screening in December 2016 noted "Abdominal aortic ectasia 2.8 cm. Ectatic abdominal aorta at risk for aneurysm development. Recommend follow-up by ultrasound in 5 years".  Repeat 04/25/2021 noted ongoing measurement of 2.8 cm to continue 5 year follow-up.   Satisfied with current treatment? yes Duration of hypertension: chronic BP monitoring frequency: daily BP range: 99/53 to 133/66 -- on average at goal <110/80 --HR 50-60 on average BP medication side effects: no Duration of hyperlipidemia: chronic Cholesterol medication side effects: no, Cholesterol supplements: fish oil Medication compliance: good compliance Aspirin: yes Recent stressors: no Recurrent headaches: no Visual changes: no Palpitations: no Dyspnea: no Chest pain: no Lower extremity edema: no Dizzy/lightheaded: no   COPD Smoking 1/2 PPD at this time.  Has smoked since age 34. CT lung screening noted mild centrilobular emphysema and aortic atherosclerosis, last screening 11/18/2021. COPD status: stable Satisfied with current treatment?: yes Oxygen use: no Dyspnea frequency: none Cough frequency: occasional, mainly in morning time Rescue inhaler frequency:  none at this time Limitation of activity: no Productive cough: none Last Spirometry: 10/10/21 FEV1 104% and FEV1/FVC 105% Pneumovax: Up to Date Influenza: Up to  Date  VITAMIN D DEFICIENCY: Takes daily supplement.  No recent falls or fractures.  Recent levels stable.       04/10/2022    8:42 AM 10/10/2021    8:53 AM 09/29/2020    9:12 AM 09/27/2020    8:16 AM 09/17/2019    9:54 AM  Depression screen PHQ 2/9  Decreased Interest 0 0 0 0 0  Down, Depressed, Hopeless 0 0 0 0 0  PHQ - 2 Score 0 0 0 0 0  Altered sleeping 0 0   0  Tired, decreased energy 1 1   0  Change in appetite 0 0   0  Feeling bad or failure about yourself  0 0   0  Trouble concentrating 0 0   0  Moving slowly or fidgety/restless 0 0   0  Suicidal thoughts 0 0   0  PHQ-9 Score 1 1   0  Difficult doing work/chores Not difficult at all Not difficult at all         Relevant past medical, surgical, family and social history reviewed and updated as indicated. Interim medical history since our last visit reviewed. Allergies and medications reviewed and updated.  Review of Systems  Constitutional:  Negative for activity change, diaphoresis, fatigue and fever.  Respiratory:  Negative for cough, chest tightness, shortness of breath and wheezing.   Cardiovascular:  Negative for chest pain, palpitations and leg swelling.  Gastrointestinal: Negative.   Neurological: Negative.   Psychiatric/Behavioral: Negative.     Per HPI unless specifically indicated above  Objective:    BP 130/76 (BP Location: Left Arm, Cuff Size: Normal)   Pulse (!) 54   Temp 98 F (36.7 C) (Oral)   Ht 5' 9.5" (1.765 m)   Wt 209 lb 6.4 oz (95 kg)   SpO2 95%   BMI 30.48 kg/m   Wt Readings from Last 3 Encounters:  04/10/22 209 lb 6.4 oz (95 kg)  11/18/21 208 lb (94.3 kg)  10/10/21 210 lb (95.3 kg)    Physical Exam Vitals and nursing note reviewed.  Constitutional:      General: He is awake. He is not in acute distress.    Appearance: He is well-developed, well-groomed and overweight. He is not ill-appearing.  HENT:     Head: Normocephalic and atraumatic.     Right Ear: Hearing normal. No  drainage.     Left Ear: Hearing normal. No drainage.  Eyes:     General: Lids are normal.        Right eye: No discharge.        Left eye: No discharge.     Conjunctiva/sclera: Conjunctivae normal.     Pupils: Pupils are equal, round, and reactive to light.  Neck:     Thyroid: No thyromegaly.     Vascular: No carotid bruit.     Trachea: Trachea normal.  Cardiovascular:     Rate and Rhythm: Normal rate and regular rhythm.     Heart sounds: Normal heart sounds, S1 normal and S2 normal. No murmur heard.    No gallop.  Pulmonary:     Effort: Pulmonary effort is normal. No accessory muscle usage or respiratory distress.     Breath sounds: Normal breath sounds.  Abdominal:     General: Bowel sounds are normal.     Palpations: Abdomen is soft. There is no hepatomegaly or splenomegaly.  Musculoskeletal:        General: Normal range of motion.     Cervical back: Normal range of motion and neck supple.     Right lower leg: No edema.     Left lower leg: No edema.  Skin:    General: Skin is warm and dry.     Capillary Refill: Capillary refill takes less than 2 seconds.  Neurological:     Mental Status: He is alert and oriented to person, place, and time.  Psychiatric:        Attention and Perception: Attention normal.        Mood and Affect: Mood normal.        Speech: Speech normal.        Behavior: Behavior normal. Behavior is cooperative.        Thought Content: Thought content normal.    Results for orders placed or performed in visit on 64/68/03  Basic metabolic panel  Result Value Ref Range   Glucose 103 (H) 70 - 99 mg/dL   BUN 22 8 - 27 mg/dL   Creatinine, Ser 0.98 0.76 - 1.27 mg/dL   eGFR 82 >59 mL/min/1.73   BUN/Creatinine Ratio 22 10 - 24   Sodium 142 134 - 144 mmol/L   Potassium 4.6 3.5 - 5.2 mmol/L   Chloride 107 (H) 96 - 106 mmol/L   CO2 24 20 - 29 mmol/L   Calcium 10.0 8.6 - 10.2 mg/dL  CBC with Differential/Platelet  Result Value Ref Range   WBC 5.8 3.4 -  10.8 x10E3/uL   RBC 5.19 4.14 - 5.80 x10E6/uL   Hemoglobin 15.9 13.0 - 17.7 g/dL  Hematocrit 46.4 37.5 - 51.0 %   MCV 89 79 - 97 fL   MCH 30.6 26.6 - 33.0 pg   MCHC 34.3 31.5 - 35.7 g/dL   RDW 13.9 11.6 - 15.4 %   Platelets 188 150 - 450 x10E3/uL   Neutrophils 55 Not Estab. %   Lymphs 35 Not Estab. %   Monocytes 9 Not Estab. %   Eos 1 Not Estab. %   Basos 0 Not Estab. %   Neutrophils Absolute 3.1 1.4 - 7.0 x10E3/uL   Lymphocytes Absolute 2.0 0.7 - 3.1 x10E3/uL   Monocytes Absolute 0.5 0.1 - 0.9 x10E3/uL   EOS (ABSOLUTE) 0.1 0.0 - 0.4 x10E3/uL   Basophils Absolute 0.0 0.0 - 0.2 x10E3/uL   Immature Granulocytes 0 Not Estab. %   Immature Grans (Abs) 0.0 0.0 - 0.1 x10E3/uL  Lipid Panel w/o Chol/HDL Ratio  Result Value Ref Range   Cholesterol, Total 153 100 - 199 mg/dL   Triglycerides 90 0 - 149 mg/dL   HDL 74 >39 mg/dL   VLDL Cholesterol Cal 17 5 - 40 mg/dL   LDL Chol Calc (NIH) 62 0 - 99 mg/dL  TSH  Result Value Ref Range   TSH 1.570 0.450 - 4.500 uIU/mL  Microalbumin, Urine Waived  Result Value Ref Range   Microalb, Ur Waived 30 (H) 0 - 19 mg/L   Creatinine, Urine Waived 300 10 - 300 mg/dL   Microalb/Creat Ratio <30 <30 mg/g  Bayer DCA Hb A1c Waived  Result Value Ref Range   HB A1C (BAYER DCA - WAIVED) 5.6 4.8 - 5.6 %      Assessment & Plan:   Problem List Items Addressed This Visit       Cardiovascular and Mediastinum   Aortic atherosclerosis ([place])    Noted on Lung CT screening.  Recommend complete cessation of smoking.  Continue daily statin and ASA for prevention.      Relevant Medications   rosuvastatin (CRESTOR) 40 MG tablet   Other Relevant Orders   Lipid Panel w/o Chol/HDL Ratio   Aortic ectasia, abdominal (HCC)    Ongoing noted on imaging.  Continue to monitor BP and ensure good control.  Recommend continue ASA and statin daily, adjust as needed + complete cessation smoking.  Repeat in August 2027.      Relevant Medications   rosuvastatin (CRESTOR)  40 MG tablet   Other Relevant Orders   Basic metabolic panel   Lipid Panel w/o Chol/HDL Ratio   Essential hypertension    Chronic, ongoing BP at goal in office and at home -- he documents daily and brings to all visits, will scan into chart.  Will continue focus on DASH diet at home and recommend complete smoking cessation.  Initiate medication as needed if home readings elevated -- consider ARB due to urine ALB 17 October 2021. Labs today: BMP.  Return in 6 months.       Relevant Medications   rosuvastatin (CRESTOR) 40 MG tablet   Other Relevant Orders   Basic metabolic panel     Respiratory   Centrilobular emphysema ([place]) - Primary    Noted on CT lung CA screening.  Spirometry January 2023 remains stable FEV1 104% and FEV1/FVC 109% compared to January 2022 = FEV1 109% and FEV1/FVC 108% pre.  Currently no symptoms and no inhaler regimen.  Will plan to initiate inhaler regimen as needed and recommend complete cessation of smoking.  Continue yearly lung screening and spirometry.  Other   Hyperlipidemia    Chronic, stable.  Continue current medication regimen and adjust as needed.  Check lipid panel today, last LDL >70, may adjust statin dose if ongoing elevation.       Relevant Medications   rosuvastatin (CRESTOR) 40 MG tablet   Other Relevant Orders   Lipid Panel w/o Chol/HDL Ratio   Nicotine dependence, cigarettes, uncomplicated    I have recommended complete cessation of tobacco use. I have discussed various options available for assistance with tobacco cessation including over the counter methods (Nicotine gum, patch and lozenges). We also discussed prescription options (Chantix, Nicotine Inhaler / Nasal Spray). The patient is not interested in pursuing any prescription tobacco cessation options at this time.  Continue annual lung screening.      Obesity    BMI 30.48.  Recommended eating smaller high protein, low fat meals more frequently and exercising 30 mins a day 5 times  a week with a goal of 10-15lb weight loss in the next 3 months. Patient voiced their understanding and motivation to adhere to these recommendations.       Vitamin D deficiency    Ongoing, stable.  Continue supplement and recheck Vit D level today.  Adjust supplement as needed.      Relevant Orders   VITAMIN D 25 Hydroxy (Vit-D Deficiency, Fractures)     Follow up plan: Return in about 6 months (around 10/11/2022) for COPD, HTN/HLD, PREDIABETES, AORTIC ECTASIA, VIT D.

## 2022-04-10 NOTE — Assessment & Plan Note (Signed)
Ongoing, stable.  Continue supplement and recheck Vit D level today.  Adjust supplement as needed. 

## 2022-04-10 NOTE — Assessment & Plan Note (Signed)
Ongoing noted on imaging.  Continue to monitor BP and ensure good control.  Recommend continue ASA and statin daily, adjust as needed + complete cessation smoking.  Repeat in August 2027. 

## 2022-04-10 NOTE — Assessment & Plan Note (Addendum)
Chronic, stable.  Continue current medication regimen and adjust as needed.  Check lipid panel today, last LDL >70, may adjust statin dose if ongoing elevation.

## 2022-04-10 NOTE — Assessment & Plan Note (Signed)
BMI 30.48.  Recommended eating smaller high protein, low fat meals more frequently and exercising 30 mins a day 5 times a week with a goal of 10-15lb weight loss in the next 3 months. Patient voiced their understanding and motivation to adhere to these recommendations.  

## 2022-04-10 NOTE — Assessment & Plan Note (Signed)
I have recommended complete cessation of tobacco use. I have discussed various options available for assistance with tobacco cessation including over the counter methods (Nicotine gum, patch and lozenges). We also discussed prescription options (Chantix, Nicotine Inhaler / Nasal Spray). The patient is not interested in pursuing any prescription tobacco cessation options at this time.  Continue annual lung screening. 

## 2022-04-10 NOTE — Assessment & Plan Note (Signed)
Chronic, ongoing BP at goal in office and at home -- he documents daily and brings to all visits, will scan into chart.  Will continue focus on DASH diet at home and recommend complete smoking cessation.  Initiate medication as needed if home readings elevated -- consider ARB due to urine ALB 17 October 2021. Labs today: BMP.  Return in 6 months.

## 2022-04-10 NOTE — Assessment & Plan Note (Signed)
Noted on Lung CT screening.  Recommend complete cessation of smoking.  Continue daily statin and ASA for prevention. 

## 2022-04-10 NOTE — Assessment & Plan Note (Signed)
Noted on CT lung CA screening.  Spirometry January 2023 remains stable FEV1 104% and FEV1/FVC 109% compared to January 2022 = FEV1 109% and FEV1/FVC 108% pre.  Currently no symptoms and no inhaler regimen.  Will plan to initiate inhaler regimen as needed and recommend complete cessation of smoking.  Continue yearly lung screening and spirometry.

## 2022-04-11 LAB — LIPID PANEL W/O CHOL/HDL RATIO
Cholesterol, Total: 151 mg/dL (ref 100–199)
HDL: 71 mg/dL (ref >39)
LDL Chol Calc (NIH): 61 mg/dL (ref 0–99)
Triglycerides: 106 mg/dL (ref 0–149)
VLDL Cholesterol Cal: 19 mg/dL (ref 5–40)

## 2022-04-11 LAB — BASIC METABOLIC PANEL
BUN/Creatinine Ratio: 21 (ref 10–24)
BUN: 18 mg/dL (ref 8–27)
CO2: 21 mmol/L (ref 20–29)
Calcium: 9.9 mg/dL (ref 8.6–10.2)
Chloride: 107 mmol/L — ABNORMAL HIGH (ref 96–106)
Creatinine, Ser: 0.84 mg/dL (ref 0.76–1.27)
Glucose: 85 mg/dL (ref 70–99)
Potassium: 4.5 mmol/L (ref 3.5–5.2)
Sodium: 143 mmol/L (ref 134–144)
eGFR: 93 mL/min/{1.73_m2} (ref >59)

## 2022-04-11 LAB — VITAMIN D 25 HYDROXY (VIT D DEFICIENCY, FRACTURES): Vit D, 25-Hydroxy: 45.2 ng/mL (ref 30.0–100.0)

## 2022-04-11 NOTE — Progress Notes (Signed)
Contacted via [place] [name], your labs have returned and overall they remain nice and stable.  Kidney function, creatinine and eGFR, remains normal.  Cholesterol levels at goal.  Vitamin D normal.  Continue all current medications, you are rocking it!! Keep being amazing!!  Thank you for allowing me to participate in your care.  I appreciate you. Kindest regards, Shawnelle Spoerl

## 2022-05-12 ENCOUNTER — Encounter: Payer: Self-pay | Admitting: Gastroenterology

## 2022-05-15 ENCOUNTER — Encounter: Payer: Self-pay | Admitting: Gastroenterology

## 2022-05-15 ENCOUNTER — Ambulatory Visit: Payer: PPO | Admitting: Registered Nurse

## 2022-05-15 ENCOUNTER — Ambulatory Visit
Admission: RE | Admit: 2022-05-15 | Discharge: 2022-05-15 | Disposition: A | Discharge status: Home / Self Care | Payer: PPO | Attending: Gastroenterology | Admitting: Gastroenterology

## 2022-05-15 ENCOUNTER — Encounter
Admission: RE | Disposition: A | Discharge status: Home / Self Care | Payer: Self-pay | Source: Home / Self Care | Attending: Gastroenterology

## 2022-05-15 ENCOUNTER — Other Ambulatory Visit: Payer: Self-pay

## 2022-05-15 DIAGNOSIS — I2510 Atherosclerotic heart disease of native coronary artery without angina pectoris: Secondary | ICD-10-CM | POA: Diagnosis not present

## 2022-05-15 DIAGNOSIS — D125 Benign neoplasm of sigmoid colon: Secondary | ICD-10-CM | POA: Insufficient documentation

## 2022-05-15 DIAGNOSIS — J432 Centrilobular emphysema: Secondary | ICD-10-CM | POA: Insufficient documentation

## 2022-05-15 DIAGNOSIS — E785 Hyperlipidemia, unspecified: Secondary | ICD-10-CM | POA: Diagnosis not present

## 2022-05-15 DIAGNOSIS — K5730 Diverticulosis of large intestine without perforation or abscess without bleeding: Secondary | ICD-10-CM | POA: Diagnosis not present

## 2022-05-15 DIAGNOSIS — D120 Benign neoplasm of cecum: Secondary | ICD-10-CM | POA: Insufficient documentation

## 2022-05-15 DIAGNOSIS — K635 Polyp of colon: Secondary | ICD-10-CM | POA: Diagnosis not present

## 2022-05-15 DIAGNOSIS — I10 Essential (primary) hypertension: Secondary | ICD-10-CM | POA: Insufficient documentation

## 2022-05-15 DIAGNOSIS — Z09 Encounter for follow-up examination after completed treatment for conditions other than malignant neoplasm: Secondary | ICD-10-CM | POA: Diagnosis present

## 2022-05-15 DIAGNOSIS — D126 Benign neoplasm of colon, unspecified: Secondary | ICD-10-CM | POA: Diagnosis not present

## 2022-05-15 DIAGNOSIS — Z86010 Personal history of colonic polyps: Secondary | ICD-10-CM | POA: Diagnosis not present

## 2022-05-15 DIAGNOSIS — D123 Benign neoplasm of transverse colon: Secondary | ICD-10-CM | POA: Insufficient documentation

## 2022-05-15 DIAGNOSIS — F17210 Nicotine dependence, cigarettes, uncomplicated: Secondary | ICD-10-CM | POA: Diagnosis not present

## 2022-05-15 HISTORY — PX: COLONOSCOPY WITH PROPOFOL: SHX5780

## 2022-05-15 SURGERY — COLONOSCOPY WITH PROPOFOL
Anesthesia: General

## 2022-05-15 MED ORDER — LIDOCAINE HCL (PF) 2 % IJ SOLN
INTRAMUSCULAR | Status: AC
  Filled 2022-05-15: qty 30

## 2022-05-15 MED ORDER — PROPOFOL 10 MG/ML IV BOLUS
INTRAVENOUS | Status: DC | PRN
  Administered 2022-05-15: 90 mg via INTRAVENOUS

## 2022-05-15 MED ORDER — LIDOCAINE HCL (CARDIAC) PF 100 MG/5ML IV SOSY
PREFILLED_SYRINGE | INTRAVENOUS | Status: DC | PRN
  Administered 2022-05-15: 100 mg via INTRAVENOUS

## 2022-05-15 MED ORDER — DEXMEDETOMIDINE HCL IN NACL 80 MCG/20ML IV SOLN
INTRAVENOUS | Status: AC
  Filled 2022-05-15: qty 20

## 2022-05-15 MED ORDER — PROPOFOL 500 MG/50ML IV EMUL
INTRAVENOUS | Status: DC | PRN
  Administered 2022-05-15: 200 ug/kg/min via INTRAVENOUS

## 2022-05-15 MED ORDER — PROPOFOL 1000 MG/100ML IV EMUL
INTRAVENOUS | Status: AC
  Filled 2022-05-15: qty 200

## 2022-05-15 MED ORDER — PROPOFOL 1000 MG/100ML IV EMUL
INTRAVENOUS | Status: AC
  Filled 2022-05-15: qty 100

## 2022-05-15 MED ORDER — SODIUM CHLORIDE 0.9 % IV SOLN: INTRAVENOUS | Status: DC

## 2022-05-15 NOTE — H&P (Signed)
Jim Bellows, MD [address] [place], [place], [place], [id number], [place], [place], [place], [address] Phone: [phone]  Fax: [phone]  Primary Care Physician:  Venita Lick, NP   Pre-Procedure History & Physical: HPI:  Jim Bauer is a 71 y.o. male is here for an colonoscopy.   Past Medical History:  Diagnosis Date   Allergy 2015   Centrilobular emphysema ([place]) 10/28/2019   Essential hypertension    Hyperlipidemia    Personal history of tobacco use, presenting hazards to health 08/20/2015    Past Surgical History:  Procedure Laterality Date   COLONOSCOPY     COLONOSCOPY WITH PROPOFOL N/A 06/21/2015   Procedure: COLONOSCOPY WITH PROPOFOL;  Surgeon: Lollie Sails, MD;  Location: [facility] ENDOSCOPY;  Service: Endoscopy;  Laterality: N/A;   COLONOSCOPY WITH PROPOFOL N/A 05/07/2019   Procedure: COLONOSCOPY WITH PROPOFOL;  Surgeon: Jim Bellows, MD;  Location: [facility] ENDOSCOPY;  Service: Gastroenterology;  Laterality: N/A;   KNEE ARTHROSCOPY     TONSILLECTOMY      Prior to Admission medications   Medication Sig Start Date End Date Taking? Authorizing Provider  rosuvastatin (CRESTOR) 40 MG tablet Take 1 tablet (40 mg total) by mouth daily. 04/10/22  Yes Cannady, Henrine Screws T, NP  aspirin EC 81 MG tablet Take 81 mg by mouth daily.    [provider]  Cholecalciferol (D2000 ULTRA STRENGTH) 50 MCG (2000 UT) CAPS Take 2,000 Units by mouth daily.    [provider]  EPINEPHrine 0.3 mg/0.3 mL IJ SOAJ injection Inject 0.3 mg into the muscle as needed for anaphylaxis. 04/01/21   Cannady, Jolene T, NP  Omega 3 1000 MG CAPS Take 1 capsule by mouth daily.    [provider]    Allergies as of 01/25/2022 - Review Complete 01/25/2022  Allergen Reaction Noted   Bee venom Swelling 06/21/2015    Family History  Problem Relation Age of Onset   Macular degeneration Mother    Diabetes Father    Parkinson's disease Father    Hypertension Father     Obesity Brother    Sleep apnea Brother    Melanoma Daughter    Diverticulitis Maternal Grandmother    Alcohol abuse Maternal Grandfather    Heart disease Paternal Grandmother    Stroke Paternal Grandfather     Social History   Socioeconomic History   Marital status: Widowed    Spouse name: Not on file   Number of children: Not on file   Years of education: Not on file   Highest education level: Not on file  Occupational History   Occupation: retired  Tobacco Use   Smoking status: Every Day    Packs/day: 0.50    Years: 43.00    Total pack years: 21.50    Types: Cigarettes   Smokeless tobacco: Never  Vaping Use   Vaping Use: Never used  Substance and Sexual Activity   Alcohol use: Yes    Alcohol/week: 14.0 standard drinks of alcohol    Types: 14 Glasses of wine per week    Comment: daily   Drug use: Never   Sexual activity: Not Currently    Birth control/protection: None  Other Topics Concern   Not on file  Social History Narrative   Not on file   Social Determinants of Health   Financial Resource Strain: Low Risk  (09/27/2020)   Overall Financial Resource Strain (CARDIA)    Difficulty of Paying Living Expenses: Not hard at all  Food Insecurity:  No Food Insecurity (09/27/2020)   Hunger Vital Sign    Worried About Running Out of Food in the Last Year: Never true    Ran Out of Food in the Last Year: Never true  Transportation Needs: No Transportation Needs (09/27/2020)   PRAPARE - Hydrologist (Medical): No    Lack of Transportation (Non-Medical): No  Physical Activity: Sufficiently Active (09/27/2020)   Exercise Vital Sign    Days of Exercise per Week: 3 days    Minutes of Exercise per Session: 150+ min  Stress: No Stress Concern Present (09/27/2020)   [place]    Feeling of Stress : Not at all  Social Connections: Not on file  Intimate Partner Violence: Not on file     Review of Systems: See HPI, otherwise negative ROS  Physical Exam: BP (!) 145/90   Pulse 62   Temp (!) 96.6 F (35.9 C) (Temporal)   Resp 20   Ht 5' 9.5" (1.765 m)   Wt 89.8 kg   SpO2 98%   BMI 28.82 kg/m  General:   Alert,  pleasant and cooperative in NAD Head:  Normocephalic and atraumatic. Neck:  Supple; no masses or thyromegaly. Lungs:  Clear throughout to auscultation, normal respiratory effort.    Heart:  +S1, +S2, Regular rate and rhythm, No edema. Abdomen:  Soft, nontender and nondistended. Normal bowel sounds, without guarding, and without rebound.   Neurologic:  Alert and  oriented x4;  grossly normal neurologically.  Impression/Plan: Jim Bauer is here for an colonoscopy to be performed for surveillance due to prior history of colon polyps   Risks, benefits, limitations, and alternatives regarding  colonoscopy have been reviewed with the patient.  Questions have been answered.  All parties agreeable.   Jim Bellows, MD  05/15/2022, 7:48 AM

## 2022-05-15 NOTE — Op Note (Signed)
[facility] Patient Name: Jim Bauer Procedure Date: 05/15/2022 7:50 AM MRN: [id number] Account #: [id number] Date of Birth: 1950-11-12 Admit Type: Outpatient Age: 71 Room: [facility] ENDO ROOM 1 Gender: Male Note Status: Finalized Instrument Name: Jasper Riling [id number] Procedure:             Colonoscopy Indications:           Surveillance: Personal history of adenomatous polyps                         on last colonoscopy 3 years ago Providers:             Jonathon Bellows MD, MD Referring MD:          Barbaraann Faster. Ned Card (Referring MD) Medicines:             Monitored Anesthesia Care Complications:         No immediate complications. Procedure:             Pre-Anesthesia Assessment:                        - Prior to the procedure, a History and Physical was                         performed, and patient medications, allergies and                         sensitivities were reviewed. The patient's tolerance                         of previous anesthesia was reviewed.                        - The risks and benefits of the procedure and the                         sedation options and risks were discussed with the                         patient. All questions were answered and informed                         consent was obtained.                        - ASA Grade Assessment: II - A patient with mild                         systemic disease.                        After obtaining informed consent, the colonoscope was                         passed under direct vision. Throughout the procedure,                         the patient's blood pressure, pulse, and oxygen                         saturations  were monitored continuously. The                         Colonoscope was introduced through the anus and                         advanced to the the cecum, identified by appendiceal                         orifice and ileocecal valve. The colonoscopy was                          performed with ease. The patient tolerated the                         procedure well. The quality of the bowel preparation                         was excellent. Findings:      The perianal and digital rectal examinations were normal.      A 10 mm polyp was found in the sigmoid colon. The polyp was       semi-pedunculated. The polyp was removed with a hot snare. Resection and       retrieval were complete.      A 5 mm polyp was found in the transverse colon. The polyp was sessile.       The polyp was removed with a cold snare. Resection and retrieval were       complete.      Multiple small and large-mouthed diverticula were found in the entire       colon.      A 25 mm polyp was found in the cecum. The polyp was sessile.       Preparations were made for mucosal resection. Eleview was injected to       raise the lesion. Piecemeal mucosal resection using a snare was       performed. Resection was complete, and retrieval was complete.       Coagulation for destruction of remaining portion of lesion using snare       was successful. To prevent bleeding after mucosal resection, three       hemostatic clips were successfully placed. There was no bleeding during,       or at the end, of the procedure. borders of polyp marked using blue or       nbi light      The exam was otherwise without abnormality on direct and retroflexion       views. Impression:            - One 10 mm polyp in the sigmoid colon, removed with a                         hot snare. Resected and retrieved.                        - One 5 mm polyp in the transverse colon, removed with                         a cold snare. Resected and retrieved.                        -  Diverticulosis in the entire examined colon.                        - One 25 mm polyp in the cecum, removed with mucosal                         resection. Resected and retrieved. Treated with a hot                         snare. Clips were  placed.                        - The examination was otherwise normal on direct and                         retroflexion views.                        - Mucosal resection was performed. Resection was                         complete, and retrieval was complete. Recommendation:        - Discharge patient to home (with escort).                        - Resume previous diet.                        - Continue present medications.                        - Await pathology results.                        - Repeat colonoscopy in 6 months for surveillance                         after piecemeal polypectomy. Procedure Code(s):     --- Professional ---                        [id number], Colonoscopy, flexible; with endoscopic mucosal                         resection                        [id number], 32, Colonoscopy, flexible; with removal of                         tumor(s), polyp(s), or other lesion(s) by snare                         technique Diagnosis Code(s):     --- Professional ---                        Z86.010, Personal history of colonic polyps                        K63.5, Polyp of colon  K57.30, Diverticulosis of large intestine without                         perforation or abscess without bleeding CPT copyright 2019 American Medical Association. All rights reserved. The codes documented in this report are preliminary and upon coder review may  be revised to meet current compliance requirements. Jonathon Bellows, MD Jonathon Bellows MD, MD 05/15/2022 8:24:31 AM This report has been signed electronically. Number of Addenda: 0 Note Initiated On: 05/15/2022 7:50 AM Scope Withdrawal Time: 0 hours 24 minutes 59 seconds  Total Procedure Duration: 0 hours 27 minutes 29 seconds  Estimated Blood Loss:  Estimated blood loss: none.      [facility]

## 2022-05-15 NOTE — Transfer of Care (Signed)
Immediate Anesthesia Transfer of Care Note  Patient: Jim Bauer  Procedure(s) Performed: COLONOSCOPY WITH PROPOFOL  Patient Location: Endoscopy Unit  Anesthesia Type:General  Level of Consciousness: drowsy  Airway & Oxygen Therapy: Patient Spontanous Breathing  Post-op Assessment: Report given to RN and Post -op Vital signs reviewed and stable  Post vital signs: Reviewed and stable  Last Vitals:  Vitals Value Taken Time  BP    Temp    Pulse 67 05/15/22 0826  Resp 22 05/15/22 0826  SpO2 95 % 05/15/22 0826  Vitals shown include unvalidated device data.  Last Pain:  Vitals:   05/15/22 0708  TempSrc: Temporal  PainSc: 0-No pain         Complications: No notable events documented.

## 2022-05-15 NOTE — Anesthesia Preprocedure Evaluation (Signed)
Anesthesia Evaluation  Patient identified by MRN, date of birth, ID band Patient awake    Reviewed: Allergy & Precautions, NPO status , Patient's Chart, lab work & pertinent test results  History of Anesthesia Complications Negative for: history of anesthetic complications  Airway Mallampati: III  TM Distance: <3 FB Neck ROM: full    Dental  (+) Chipped, Poor Dentition, Missing   Pulmonary shortness of breath, COPD, Current Smoker and Patient abstained from smoking.,    Pulmonary exam normal        Cardiovascular Exercise Tolerance: Good hypertension, (-) angina+ CAD  Normal cardiovascular exam     Neuro/Psych negative neurological ROS  negative psych ROS   GI/Hepatic negative GI ROS, Neg liver ROS, neg GERD  ,  Endo/Other  negative endocrine ROS  Renal/GU negative Renal ROS  negative genitourinary   Musculoskeletal   Abdominal   Peds  Hematology negative hematology ROS (+)   Anesthesia Other Findings Past Medical History: 2015: Allergy 10/28/2019: Centrilobular emphysema ([place]) No date: Essential hypertension No date: Hyperlipidemia 08/20/2015: Personal history of tobacco use, presenting hazards to  health  Past Surgical History: No date: COLONOSCOPY 06/21/2015: COLONOSCOPY WITH PROPOFOL; N/A     Comment:  Procedure: COLONOSCOPY WITH PROPOFOL;  Surgeon: Lollie Sails, MD;  Location: [facility] ENDOSCOPY;  Service:               Endoscopy;  Laterality: N/A; 05/07/2019: COLONOSCOPY WITH PROPOFOL; N/A     Comment:  Procedure: COLONOSCOPY WITH PROPOFOL;  Surgeon: Jonathon Bellows, MD;  Location: [facility] ENDOSCOPY;  Service:               Gastroenterology;  Laterality: N/A; No date: KNEE ARTHROSCOPY No date: TONSILLECTOMY  BMI    Body Mass Index: 28.82 kg/m      Reproductive/Obstetrics negative OB ROS                            Anesthesia Physical Anesthesia  Plan  ASA: 3  Anesthesia Plan: General   Post-op Pain Management:    Induction: Intravenous  PONV Risk Score and Plan: Propofol infusion and TIVA  Airway Management Planned: Natural Airway and Nasal Cannula  Additional Equipment:   Intra-op Plan:   Post-operative Plan:   Informed Consent: I have reviewed the patients History and Physical, chart, labs and discussed the procedure including the risks, benefits and alternatives for the proposed anesthesia with the patient or authorized representative who has indicated his/her understanding and acceptance.     Dental Advisory Given  Plan Discussed with: Anesthesiologist, CRNA and Surgeon  Anesthesia Plan Comments: (Patient consented for risks of anesthesia including but not limited to:  - adverse reactions to medications - risk of airway placement if required - damage to eyes, teeth, lips or other oral mucosa - nerve damage due to positioning  - sore throat or hoarseness - Damage to heart, brain, nerves, lungs, other parts of body or loss of life  Patient voiced understanding.)        Anesthesia Quick Evaluation

## 2022-05-15 NOTE — Anesthesia Postprocedure Evaluation (Signed)
Anesthesia Post Note  Patient: Jim Bauer  Procedure(s) Performed: COLONOSCOPY WITH PROPOFOL  Patient location during evaluation: Endoscopy Anesthesia Type: General Level of consciousness: awake and alert Pain management: pain level controlled Vital Signs Assessment: post-procedure vital signs reviewed and stable Respiratory status: spontaneous breathing, nonlabored ventilation, respiratory function stable and patient connected to nasal cannula oxygen Cardiovascular status: blood pressure returned to baseline and stable Postop Assessment: no apparent nausea or vomiting Anesthetic complications: no   No notable events documented.   Last Vitals:  Vitals:   05/15/22 0836 05/15/22 0846  BP: 120/67 (!) 163/86  Pulse: 75 61  Resp: 20 20  Temp:    SpO2: 96% 98%    Last Pain:  Vitals:   05/15/22 0846  TempSrc:   PainSc: 0-No pain                 Precious Haws [place] Too[place]

## 2022-05-16 ENCOUNTER — Encounter: Payer: Self-pay | Admitting: Gastroenterology

## 2022-05-16 LAB — SURGICAL PATHOLOGY

## 2022-07-27 ENCOUNTER — Telehealth: Payer: Self-pay

## 2022-07-27 ENCOUNTER — Other Ambulatory Visit: Payer: Self-pay

## 2022-07-27 DIAGNOSIS — Z86010 Personal history of colonic polyps: Secondary | ICD-10-CM

## 2022-07-27 MED ORDER — SUTAB 1479-225-188 MG PO TABS: 1.0000 | ORAL_TABLET | Freq: Once | ORAL | 0 refills | Status: AC

## 2022-07-27 NOTE — Telephone Encounter (Signed)
Patient received message via mychart to schedule his repeat colonoscopy with Dr. Vicente Males for March.  Repeat colonoscopy has been scheduled with Dr. Vicente Males for 11/27/22 at [facility].  Pt stated that Dr. Vicente Males recommended Sutabs.  Sutab Rx sent to [facility] on [address].  Thanks,  [place], [place]

## 2022-10-01 IMAGING — CT CT CHEST LUNG CANCER SCREENING LOW DOSE W/O CM
2 of 5 series · 15 of 40 positions shown, 18 images · non-contrast
Comparison: 11/01/2020.

CLINICAL DATA: Current smoker, 44 pack-year history.



[Series 3: lung 1.00 · axial · 0.70mm/px · z∈[-1220,-889]mm · 12 of 365 slices shown, 15 images]
[im 17/365  mediastinal]
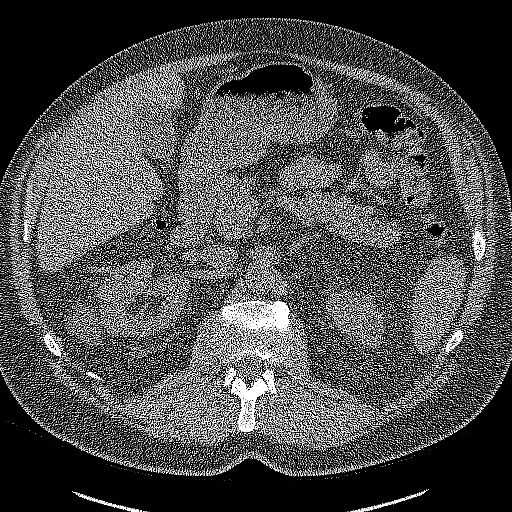
[im 17/365  lung]
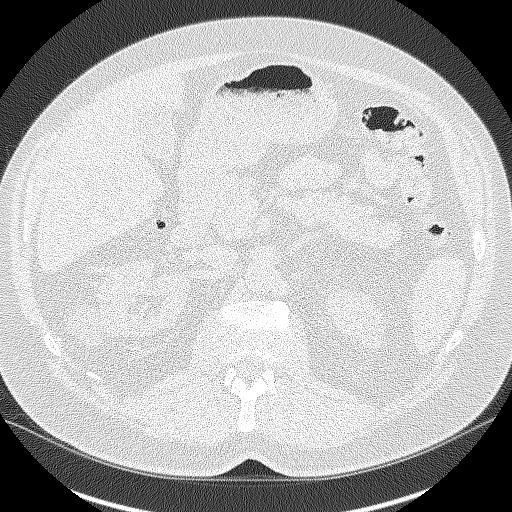
[im 50/365  lung]
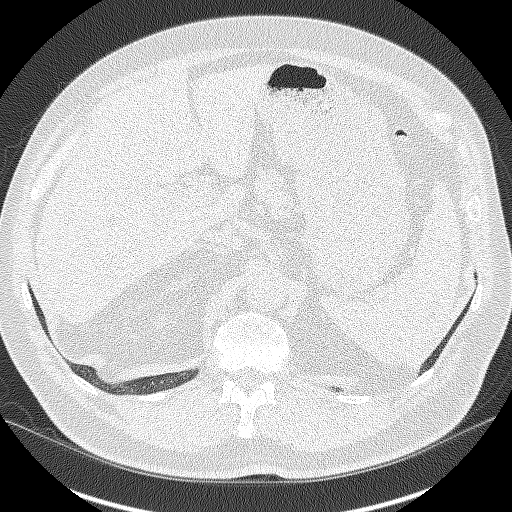
[im 83/365  lung]
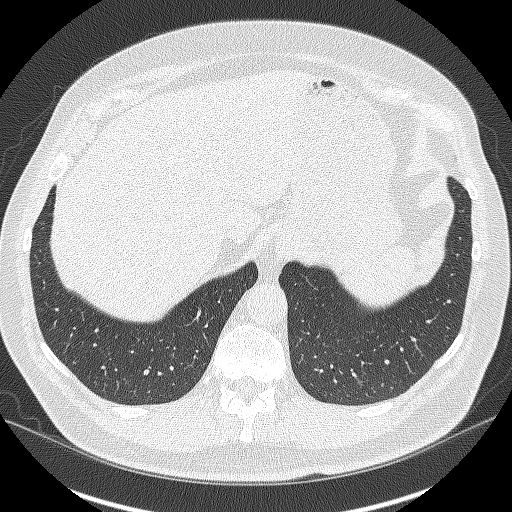
[im 116/365  lung]
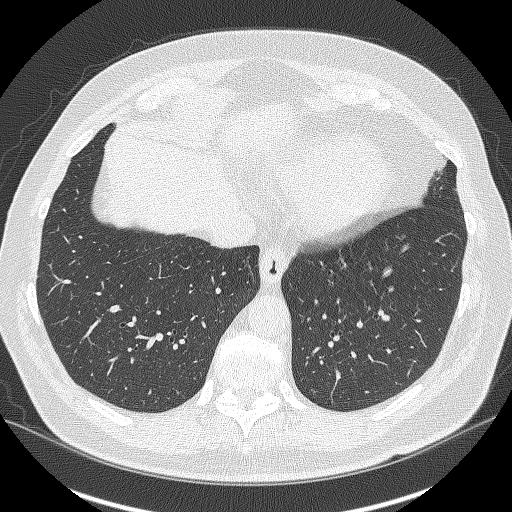
[im 133/365  mediastinal]
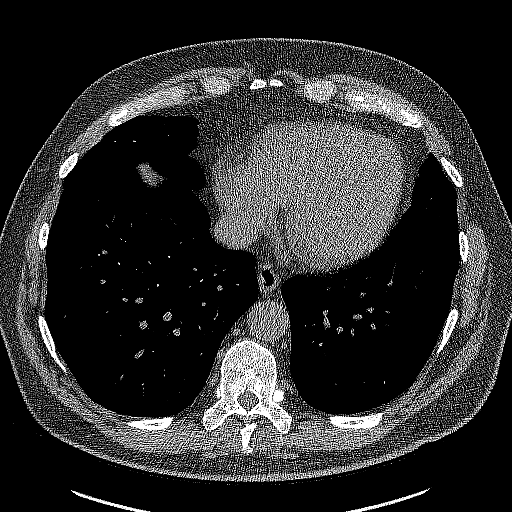
[im 133/365  lung]
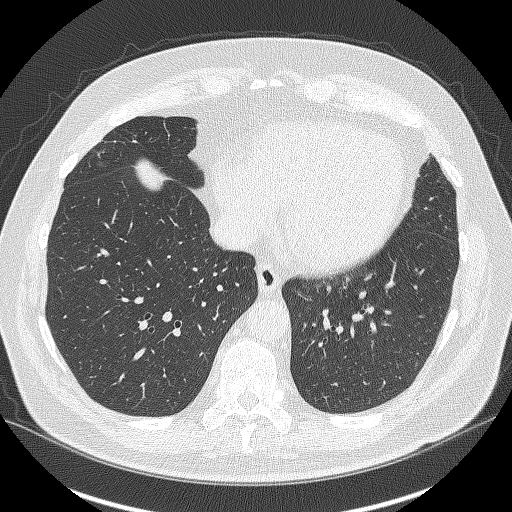
[im 166/365  lung]
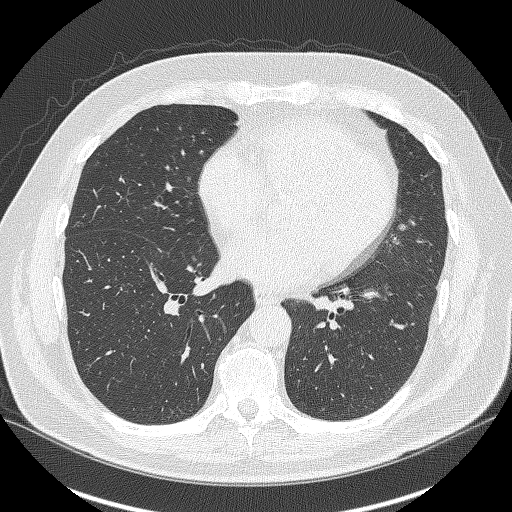
[im 199/365  lung]
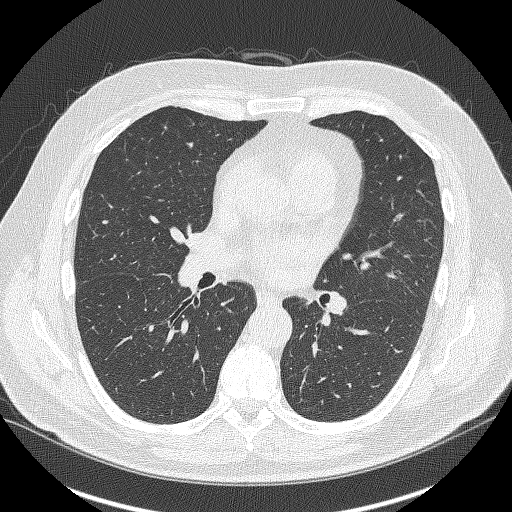
[im 232/365  lung]
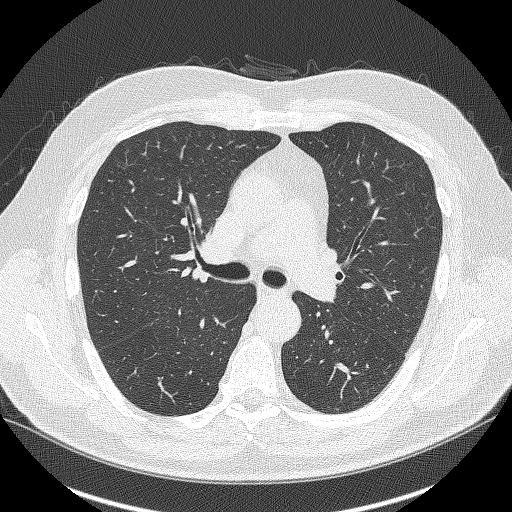
[im 249/365  mediastinal]
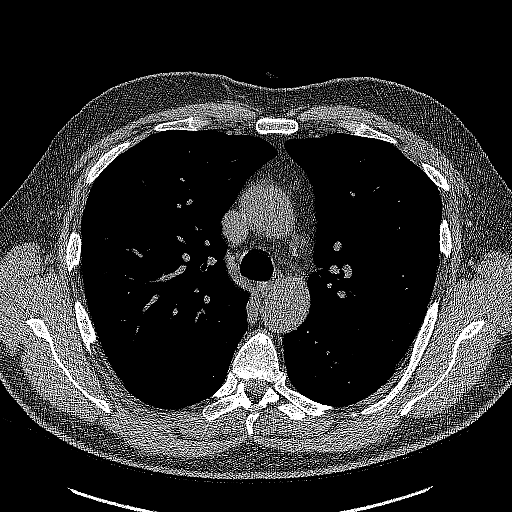
[im 249/365  lung]
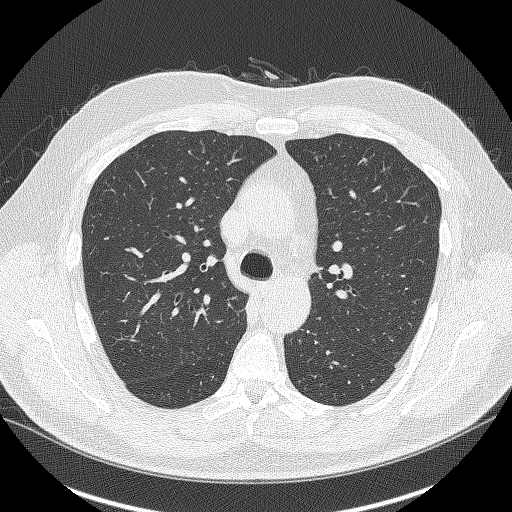
[im 282/365  lung]
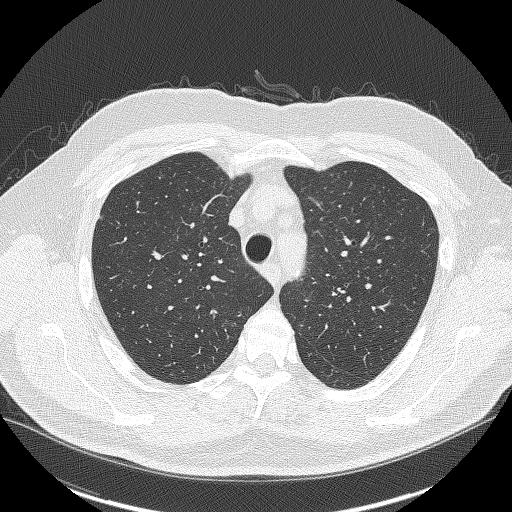
[im 315/365  lung]
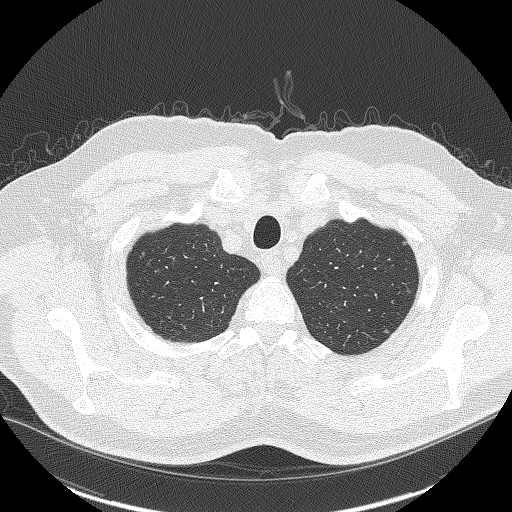
[im 348/365  lung]
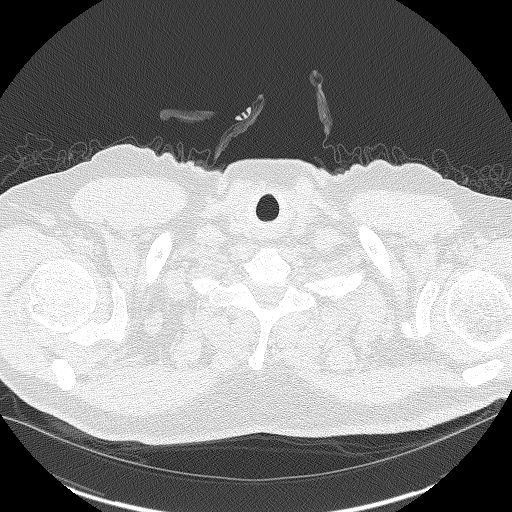

[Series 5: coronals lung 1.00 cor · coronal · 0.70mm/px · 3 of 319 slices shown]
[im 64/319  lung]
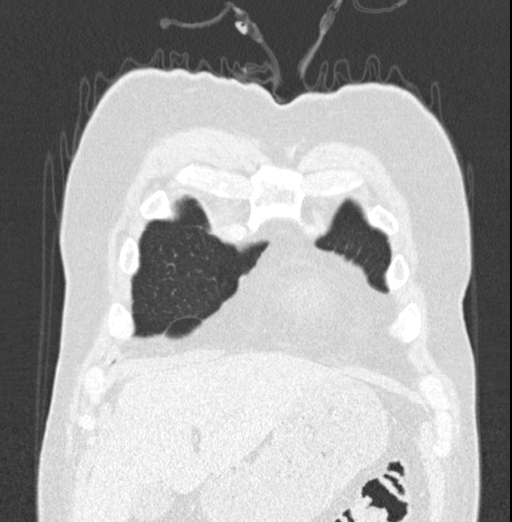
[im 128/319  lung]
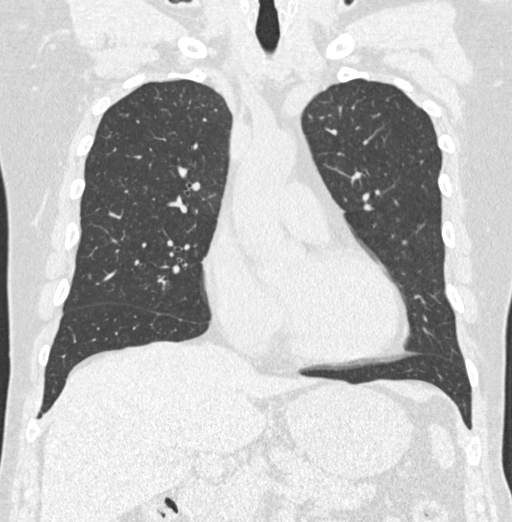
[im 191/319  lung]
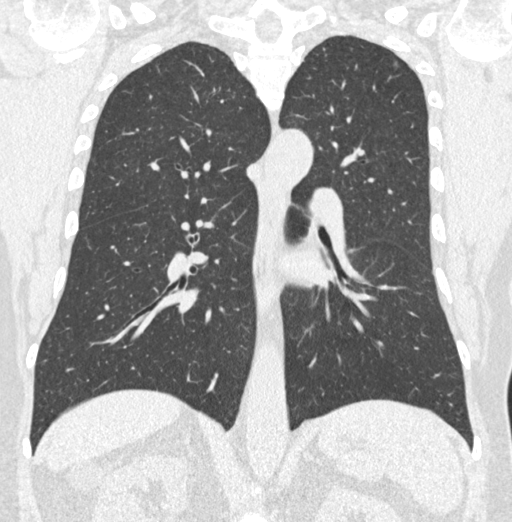

[15 of 40 positions shown; findings below may reference images not displayed]

FINDINGS: Cardiovascular: Atherosclerotic calcification of the aorta, aortic
valve and coronary arteries. Heart is at the upper limits of normal
in size. No pericardial effusion.

Mediastinum/Nodes: No pathologically enlarged mediastinal or
axillary lymph nodes. Hilar regions are difficult to definitively
evaluate without IV contrast. Esophagus is grossly unremarkable.

Lungs/Pleura: Smoking related respiratory bronchiolitis. Mild
centrilobular emphysema. Solid and non solid pulmonary nodules
measure 4.6 mm or less in size, as before. No new pulmonary nodules.
No pleural fluid. Airway is unremarkable.

Upper Abdomen: Visualized portions of the liver, gallbladder and
adrenal glands are unremarkable. 4.4 cm exophytic low-attenuation
lesion off the upper pole right kidney is indicative of a cyst.
Visualized portions of the kidneys, spleen, pancreas, stomach and
bowel are otherwise unremarkable.

Musculoskeletal: Mild degenerative changes in the spine. No
worrisome lytic or sclerotic lesions.
IMPRESSION: 1. Lung-RADS 2, benign appearance or behavior. Continue annual
screening with low-dose chest CT without contrast in 12 months.
2. Aortic atherosclerosis (9MGI5-PH7.7). Coronary artery
calcification.
3.  Emphysema ([id number].B).

## 2022-10-08 NOTE — Patient Instructions (Incomplete)
Living with COPD Being diagnosed with chronic obstructive pulmonary disease (COPD) changes your life physically and emotionally. Having COPD can affect your ability to work and do things you enjoy. COPD is not the same for everyone, and it may change over time. Your health care providers can help you come up with the COPD management plan that works best for you. How to manage lifestyle changes Treatment plan Work closely with your health care providers. Follow your COPD management plan. This plan includes: Instructions about activities, exercises, diet, medicines, what to do when COPD flares up, and when to call your health care provider. A pulmonary rehabilitation program. In pulmonary rehab, you will learn about COPD, do exercises for fitness and breathing, and get support from health care providers and other people who have COPD. Managing emotions and stress Living with a chronic disease means you may also struggle with stressful emotions, such as sadness, fear, and worry. Here are some ways to manage these emotions: Talk to someone about your fear, anxiety, depression, or stress. Learn strategies to avoid or reduce stress and ask for help if you are struggling with depression or anxiety. Consider joining a COPD support group, online or in person.  Adjusting to changes COPD may limit the things you can do, but you can make certain changes to help you cope with the diagnosis. Ask for help when you need it. Getting support from friends, family, and your health care team is an important part of managing the condition. Try to get regular exercise as prescribed by a health care provider or pulmonary rehab team. Exercising can help COPD, even if you are a bit short of breath. Take steps to prevent infection and protect your lungs: Wash your hands often and avoid being in crowds. Stay away from friends and family members who are sick. Check your local air quality each day, and stay out of areas  where air pollution is likely. How to recognize changes in your condition Recognizing changes in your COPD COPD is a progressive disease. It is important to let the health care team know if your COPD is getting worse. Your treatment plan may need to change. Watch for: Increased shortness of breath, wheezing, cough, or fatigue. Loss of ability to exercise or perform daily activities, like climbing stairs. More frequent symptom flares. Signs of depression or anxiety. Recognizing stress It is normal to have additional stress when you have COPD. However, prolonged stress and anxiety can make COPD worse and lead to depression. Recognize the warning signs, which include: Feeling sad or worried more often or most of the time. Having less energy and losing interest in pleasurable activities. Changes in your appetite or sleeping patterns. Being easily angered or irritated. Having unexplained aches and pains, digestive problems, or headaches. Follow these instructions at home: Eating and drinking  Eat foods that are high in fiber, such as fresh fruits and vegetables, whole grains, and beans. Limit foods that are high in fat and processed sugars, such as fried or sweet foods. Follow a balanced diet and maintain a healthy weight. Being overweight or underweight can make COPD worse. You may work with a dietitian as part of your pulmonary rehab program. Drink enough fluid to keep your urine pale yellow. If you drink alcohol: Limit how much you have to: 0-1 drink a day for women who are not pregnant. 0-2 drinks a day for men. Know how much alcohol is in your drink. In the [place] one drink equals one 12 oz bottle   of beer (355 mL), one 5 oz glass of wine (148 mL), or one 1 oz glass of hard liquor (44 mL). Lifestyle If you smoke, the most important thing that you can do is to stop smoking. Continuing to smoke will cause the disease to progress faster. Do not use any products that contain nicotine or  tobacco. These products include cigarettes, chewing tobacco, and vaping devices, such as e-cigarettes. If you need help quitting, ask your health care provider. Avoid exposure to things that irritate your lungs, such as smoke, chemicals, and fumes. Activity Balance exercise and rest. Take short walks every 1-2 hours. This is important to improve blood flow and breathing. Ask for help if you feel weak or unsteady. Do exercises that include controlled breathing with body movement, such as tai chi. General instructions Take over-the-counter and prescription medicines only as told by your health care provider. Take vitamin and protein supplements as told by your health care provider or dietitian. Practice good oral hygiene and see your dental care provider regularly. An oral infection can also spread to your lungs. Make sure you receive all the vaccines that your health care provider recommends. Keep all follow-up visits. This is important. Contact a health care provider if you: Are struggling to manage your COPD. Have emotional stress that interferes with your ability to cope with COPD. Get help right away if you: Have thoughts of suicide, death, or hurting yourself or others. If you ever feel like you may hurt yourself or others, or have thoughts about taking your own life, get help right away. Go to your nearest emergency department or: Call your local emergency services (911 in the [place] Call a suicide crisis helpline, such as the National Suicide Prevention Lifeline at 1-800-273-8255 or 988 in the [place] This is open 24 hours a day in the [place] Text the Crisis Text Line at 741741 (in the [place] Summary Being diagnosed with chronic obstructive pulmonary disease (COPD) changes your life physically and emotionally. Work with your health care providers and follow your COPD management plan. A pulmonary rehabilitation program is an important part of COPD management. Prolonged stress, anxiety, and  depression can make COPD worse. Let your health care provider know if emotional stress interferes with your ability to cope with and manage COPD. This information is not intended to replace advice given to you by your health care provider. Make sure you discuss any questions you have with your health care provider. Document Revised: 03/30/2021 Document Reviewed: 09/22/2020 Elsevier Patient Education  2023 [employer]  

## 2022-10-11 ENCOUNTER — Ambulatory Visit: Payer: PPO | Admitting: Nurse Practitioner

## 2022-10-15 NOTE — Patient Instructions (Signed)

## 2022-10-18 DIAGNOSIS — H43812 Vitreous degeneration, left eye: Secondary | ICD-10-CM | POA: Diagnosis not present

## 2022-10-18 DIAGNOSIS — H5213 Myopia, bilateral: Secondary | ICD-10-CM | POA: Diagnosis not present

## 2022-10-18 DIAGNOSIS — H2513 Age-related nuclear cataract, bilateral: Secondary | ICD-10-CM | POA: Diagnosis not present

## 2022-10-20 ENCOUNTER — Encounter: Payer: Self-pay | Admitting: Nurse Practitioner

## 2022-10-20 ENCOUNTER — Telehealth (INDEPENDENT_AMBULATORY_CARE_PROVIDER_SITE_OTHER): Payer: PPO | Admitting: Nurse Practitioner

## 2022-10-20 DIAGNOSIS — Z6830 Body mass index (BMI) 30.0-30.9, adult: Secondary | ICD-10-CM | POA: Diagnosis not present

## 2022-10-20 DIAGNOSIS — F17210 Nicotine dependence, cigarettes, uncomplicated: Secondary | ICD-10-CM | POA: Diagnosis not present

## 2022-10-20 DIAGNOSIS — E559 Vitamin D deficiency, unspecified: Secondary | ICD-10-CM | POA: Diagnosis not present

## 2022-10-20 DIAGNOSIS — I77811 Abdominal aortic ectasia: Secondary | ICD-10-CM | POA: Diagnosis not present

## 2022-10-20 DIAGNOSIS — E782 Mixed hyperlipidemia: Secondary | ICD-10-CM

## 2022-10-20 DIAGNOSIS — R7303 Prediabetes: Secondary | ICD-10-CM

## 2022-10-20 DIAGNOSIS — Z23 Encounter for immunization: Secondary | ICD-10-CM

## 2022-10-20 DIAGNOSIS — J432 Centrilobular emphysema: Secondary | ICD-10-CM

## 2022-10-20 DIAGNOSIS — R918 Other nonspecific abnormal finding of lung field: Secondary | ICD-10-CM

## 2022-10-20 DIAGNOSIS — N400 Enlarged prostate without lower urinary tract symptoms: Secondary | ICD-10-CM | POA: Diagnosis not present

## 2022-10-20 DIAGNOSIS — E6609 Other obesity due to excess calories: Secondary | ICD-10-CM

## 2022-10-20 DIAGNOSIS — I10 Essential (primary) hypertension: Secondary | ICD-10-CM

## 2022-10-20 DIAGNOSIS — I700 Atherosclerosis of aorta: Secondary | ICD-10-CM | POA: Diagnosis not present

## 2022-10-20 DIAGNOSIS — Z0000 Encounter for general adult medical examination without abnormal findings: Secondary | ICD-10-CM

## 2022-10-20 NOTE — Assessment & Plan Note (Signed)
I have recommended complete cessation of tobacco use. I have discussed various options available for assistance with tobacco cessation including over the counter methods (Nicotine gum, patch and lozenges). We also discussed prescription options (Chantix, Nicotine Inhaler / Nasal Spray). The patient is not interested in pursuing any prescription tobacco cessation options at this time.  Continue annual lung screening. 

## 2022-10-20 NOTE — Assessment & Plan Note (Signed)
Ongoing, stable.  Continue supplement and recheck Vit D level today.  Adjust supplement as needed.

## 2022-10-20 NOTE — Assessment & Plan Note (Signed)
Noted on Lung CT screening.  Recommend complete cessation of smoking.  Continue daily statin and ASA for prevention.

## 2022-10-20 NOTE — Assessment & Plan Note (Signed)
Ongoing noted on imaging.  Continue to monitor BP and ensure good control.  Recommend continue ASA and statin daily, adjust as needed + complete cessation smoking.  Repeat in August 2027.

## 2022-10-20 NOTE — Assessment & Plan Note (Signed)
BMI 30.48 last visit.  Recommended eating smaller high protein, low fat meals more frequently and exercising 30 mins a day 5 times a week with a goal of 10-15lb weight loss in the next 3 months. Patient voiced their understanding and motivation to adhere to these recommendations.

## 2022-10-20 NOTE — Assessment & Plan Note (Signed)
Last A1c 5.6% and urine ALB 30.  Continue diet focus and consider ARB initiation in future.

## 2022-10-20 NOTE — Assessment & Plan Note (Signed)
Chronic, ongoing BP at goal at home -- he documents daily and brings to all visits.  Will continue focus on DASH diet at home and recommend complete smoking cessation.  Initiate medication as needed if home readings elevated -- consider ARB due to urine ALB 17 October 2021. Labs today: will be done outpatient.  Return in 6 months.

## 2022-10-20 NOTE — Assessment & Plan Note (Signed)
Followed yearly by nodule clinic.  Continue to monitor.  Recommend complete smoking cessation.

## 2022-10-20 NOTE — Assessment & Plan Note (Addendum)
Ongoing.  Noted on CT lung CA screening.  Spirometry January 2023 remains stable FEV1 104% and FEV1/FVC 109%.  Currently no symptoms and no inhaler regimen.  Will plan to initiate inhaler regimen as needed and recommend complete cessation of smoking.  Continue yearly lung screening and spirometry.

## 2022-10-20 NOTE — Assessment & Plan Note (Signed)
Chronic, stable.  Continue current medication regimen and adjust as needed.  Check lipid panel outpatient, last LDL >70, may adjust statin dose if ongoing elevation.

## 2022-10-20 NOTE — Progress Notes (Signed)
There were no vitals taken for this visit.   Subjective:    Patient ID: Jim Bauer, male    DOB: April 22, 1951, 72 y.o.   MRN: [id number]  HPI: Jim Bauer is a 72 y.o. male  Chief Complaint  Patient presents with   COPD   Hypertension   Hyperlipidemia   This visit was completed via video visit through MyChart due to the restrictions of the COVID-19 pandemic. All issues as above were discussed and addressed. Physical exam was done as above through visual confirmation on video through MyChart. If it was felt that the patient should be evaluated in the office, they were directed there. The patient verbally consented to this visit. Location of the patient: home Location of the provider: work Those involved with this call:  Provider: Marnee Guarneri, DNP CMA: Frazier Butt, [place] Desk/Registration: [employer]  Time spent on call:  21 minutes with patient face to face via video conference. More than 50% of this time was spent in counseling and coordination of care. 15 minutes total spent in review of patient's record and preparation of their chart.  I verified patient identity using two factors (patient name and date of birth). Patient consents verbally to being seen via telemedicine visit today.    HYPERTENSION / HYPERLIPIDEMIA Continues on Rosuvastatin 40 MG and ASA, no current BP medications.  Have been monitoring A1c due to elevations in glucose on occasion, last check 5.6%.   AAA screening December 2016 noting "Abdominal aortic ectasia 2.8 cm. Ectatic abdominal aorta at risk for aneurysm development. Recommend follow-up by ultrasound in 5 years", 04/25/2021 noted ongoing measurement of 2.8 cm to continue 5 year follow-up.    Has Vitamin D deficiency, taking daily supplement.  No recent falls or fractures.   Satisfied with current treatment? yes Duration of hypertension: chronic BP monitoring frequency: daily BP range: on average at goal <110-120/80 --HR 60 to 80 on  average BP medication side effects: no Duration of hyperlipidemia: chronic Cholesterol medication side effects: no, Cholesterol supplements: fish oil Medication compliance: good compliance Aspirin: yes Recent stressors: no Recurrent headaches: no Visual changes: no Palpitations: no Dyspnea: no Chest pain: no Lower extremity edema: no Dizzy/lightheaded: no   COPD Mild centrilobular emphysema and aortic atherosclerosis, continues to be noticed on yearly screening.  Last CT 11/18/21.  Smoking 1/2 PPD at this time, has cut back from past 1 PPD or more. Has smoked since age 72. COPD status: stable Satisfied with current treatment?: yes Oxygen use: no Dyspnea frequency: none Cough frequency: occasional, more in morning Rescue inhaler frequency:  no Limitation of activity: no Productive cough: none Last Spirometry: 10/10/21 Pneumovax: Up to Date Influenza: Up to Date     10/20/2022    8:32 AM 04/10/2022    8:42 AM 10/10/2021    8:53 AM 09/29/2020    9:12 AM 09/27/2020    8:16 AM  Depression screen PHQ 2/9  Decreased Interest 0 0 0 0 0  Down, Depressed, Hopeless 0 0 0 0 0  PHQ - 2 Score 0 0 0 0 0  Altered sleeping 0 0 0    Tired, decreased energy 0 1 1    Change in appetite 0 0 0    Feeling bad or failure about yourself  0 0 0    Trouble concentrating 0 0 0    Moving slowly or fidgety/restless 0 0 0    Suicidal thoughts 0 0 0    PHQ-9 Score 0 1 1  Difficult doing work/chores Not difficult at all Not difficult at all Not difficult at all       Relevant past medical, surgical, family and social history reviewed and updated as indicated. Interim medical history since our last visit reviewed. Allergies and medications reviewed and updated.  Review of Systems  Constitutional:  Negative for activity change, diaphoresis, fatigue and fever.  Respiratory:  Negative for cough, chest tightness, shortness of breath and wheezing.   Cardiovascular:  Negative for chest pain, palpitations  and leg swelling.  Gastrointestinal: Negative.   Neurological: Negative.   Psychiatric/Behavioral: Negative.      Per HPI unless specifically indicated above     Objective:    There were no vitals taken for this visit.  Wt Readings from Last 3 Encounters:  05/15/22 198 lb (89.8 kg)  04/10/22 209 lb 6.4 oz (95 kg)  11/18/21 208 lb (94.3 kg)    Physical Exam Vitals and nursing note reviewed.  Constitutional:      General: He is awake. He is not in acute distress.    Appearance: He is well-developed. He is not ill-appearing.  HENT:     Head: Normocephalic.     Right Ear: Hearing normal. No drainage.     Left Ear: Hearing normal. No drainage.  Eyes:     General: Lids are normal.        Right eye: No discharge.        Left eye: No discharge.     Conjunctiva/sclera: Conjunctivae normal.  Pulmonary:     Effort: Pulmonary effort is normal. No accessory muscle usage or respiratory distress.  Musculoskeletal:     Cervical back: Normal range of motion.  Neurological:     Mental Status: He is alert and oriented to person, place, and time.  Psychiatric:        Mood and Affect: Mood normal.        Behavior: Behavior normal. Behavior is cooperative.        Thought Content: Thought content normal.        Judgment: Judgment normal.    Results for orders placed or performed during the hospital encounter of 05/15/22  Surgical pathology  Result Value Ref Range   SURGICAL PATHOLOGY      SURGICAL PATHOLOGY CASE: [id number] PATIENT: Jim Bauer Surgical Pathology Report     Specimen Submitted: A. Colon polyp, cecum; hot snare B. Colon polyp, transverse; cold snare C. Colon polyp, sigmoid; hot snare  Clinical History: History of colonic polyps Z86.010.  Colon polyps, diverticulosis    DIAGNOSIS: A. COLON POLYP, CECUM; HOT SNARE: - MULTIPLE FRAGMENTS OF TUBULAR ADENOMA. - LOW-GRADE DYSPLASIA PRESENT AT CAUTERY. - NEGATIVE FOR HIGH-GRADE DYSPLASIA AND  MALIGNANCY.  B. COLON POLYP, TRANSVERSE; COLD SNARE: - MULTIPLE FRAGMENTS OF TUBULAR ADENOMA. - NEGATIVE FOR HIGH-GRADE DYSPLASIA AND MALIGNANCY.  C. COLON POLYP, SIGMOID; HOT SNARE: - TUBULAR ADENOMA. - CAUTERIZED POLYP BASE NARROWLY FREE OF DYSPLASIA. - NEGATIVE FOR HIGH-GRADE DYSPLASIA AND MALIGNANCY.  GROSS DESCRIPTION: A. Labeled: Cecum polyp hot snare Received: Formalin Collection time: 8:04 AM on 05/15/2022 Placed into formalin time: 8:04 AM on 05/15/2022 Tissue fragment(s): Mu ltiple Size: Aggregate, 2.4 x 0.6 x 0.2 cm Description: Received are fragments of tan-pink soft tissue admixed with intestinal debris.  The ratio of soft tissue to intestinal debris is 40: 60. Entirely submitted in 1 cassette.  B. Labeled: Transverse colon polyp cold snare Received: Formalin Collection time: 8:17 AM on 05/15/2022 Placed into formalin time: 8:17 AM on 05/15/2022 Tissue  fragment(s): Multiple Size: Aggregate, 1.8 x 0.4 x 0.2 cm Description: Received are fragments of pink-tan soft tissue admixed with intestinal debris.  The ratio of soft tissue to intestinal debris is 60: 40. Entirely submitted in 1 cassette.  C. Labeled: Sigmoid colon polyp hot snare Received: Formalin Collection time: 8:20 AM on 05/15/2022 Placed into formalin time: 8:20 AM on 05/15/2022 Tissue fragment(s): 2 Size: Range from 0.4-0.6 cm Description: Received are 2 fragments of tan-pink soft tissue.  The larger fragment has a resection margin which is inked blue.  This fragment is bi sected. Entirely submitted in cassettes 1-2 with the bisected fragment in cassette 1 and the remaining fragment in cassette 2.  RB 05/15/2022  Final Diagnosis performed by Allena Napoleon, MD.   Electronically signed 05/16/2022 9:23:06AM The electronic signature indicates that the named Attending Pathologist has evaluated the specimen Technical component performed at [facility], [address] [place], [place] [address] Lab: [id number] Dir:  Rush Farmer, MD, MMM  Professional component performed at [facility], [address], [place], [place], [place] [address] Lab: [phone] Dir: Kathi Simpers, MD       Assessment & Plan:   Problem List Items Addressed This Visit       Cardiovascular and Mediastinum   Aortic atherosclerosis [facility])    Noted on Lung CT screening.  Recommend complete cessation of smoking.  Continue daily statin and ASA for prevention.      Relevant Orders   Comprehensive metabolic panel   Lipid Panel w/o Chol/HDL Ratio   Aortic ectasia, abdominal (HCC)    Ongoing noted on imaging.  Continue to monitor BP and ensure good control.  Recommend continue ASA and statin daily, adjust as needed + complete cessation smoking.  Repeat in August 2027.      Relevant Orders   Comprehensive metabolic panel   Lipid Panel w/o Chol/HDL Ratio   Essential hypertension    Chronic, ongoing BP at goal at home -- he documents daily and brings to all visits.  Will continue focus on DASH diet at home and recommend complete smoking cessation.  Initiate medication as needed if home readings elevated -- consider ARB due to urine ALB 17 October 2021. Labs today: will be done outpatient.  Return in 6 months.       Relevant Orders   Comprehensive metabolic panel   TSH   Microalbumin, Urine Waived     Respiratory   Centrilobular emphysema ([place]) - Primary    Ongoing.  Noted on CT lung CA screening.  Spirometry January 2023 remains stable FEV1 104% and FEV1/FVC 109%.  Currently no symptoms and no inhaler regimen.  Will plan to initiate inhaler regimen as needed and recommend complete cessation of smoking.  Continue yearly lung screening and spirometry.      Relevant Orders   CBC with Differential/Platelet   Pulmonary nodules    Followed yearly by nodule clinic.  Continue to monitor.  Recommend complete smoking cessation.        Other   Hyperlipidemia    Chronic, stable.  Continue current  medication regimen and adjust as needed.  Check lipid panel outpatient, last LDL >70, may adjust statin dose if ongoing elevation.       Relevant Orders   Comprehensive metabolic panel   Lipid Panel w/o Chol/HDL Ratio   Nicotine dependence, cigarettes, uncomplicated    I have recommended complete cessation of tobacco use. I have discussed various options available for assistance with tobacco cessation including over the counter  methods (Nicotine gum, patch and lozenges). We also discussed prescription options (Chantix, Nicotine Inhaler / Nasal Spray). The patient is not interested in pursuing any prescription tobacco cessation options at this time.  Continue annual lung screening.      Obesity    BMI 30.48 last visit.  Recommended eating smaller high protein, low fat meals more frequently and exercising 30 mins a day 5 times a week with a goal of 10-15lb weight loss in the next 3 months. Patient voiced their understanding and motivation to adhere to these recommendations.       Prediabetes    Last A1c 5.6% and urine ALB 30.  Continue diet focus and consider ARB initiation in future.      Relevant Orders   Microalbumin, Urine Waived   HgB A1c   Vitamin D deficiency    Ongoing, stable.  Continue supplement and recheck Vit D level today.  Adjust supplement as needed.      Relevant Orders   VITAMIN D 25 Hydroxy (Vit-D Deficiency, Fractures)    I discussed the assessment and treatment plan with the patient. The patient was provided an opportunity to ask questions and all were answered. The patient agreed with the plan and demonstrated an understanding of the instructions.   The patient was advised to call back or seek an in-person evaluation if the symptoms worsen or if the condition fails to improve as anticipated.   I provided 21+ minutes of time during this encounter.    Follow up plan: Return in about 6 months (around 04/20/2023) for HTN/HLD, COPD, AAA, VIT D -- needs spirometry.

## 2022-10-20 NOTE — Progress Notes (Signed)
Appointment has been made

## 2022-10-23 NOTE — Addendum Note (Signed)
Addended by: Marnee Guarneri T on: 10/23/2022 09:03 AM   Modules accepted: Orders

## 2022-10-25 ENCOUNTER — Other Ambulatory Visit: Payer: PPO

## 2022-10-25 DIAGNOSIS — I10 Essential (primary) hypertension: Secondary | ICD-10-CM

## 2022-10-25 DIAGNOSIS — R7303 Prediabetes: Secondary | ICD-10-CM | POA: Diagnosis not present

## 2022-10-25 DIAGNOSIS — E559 Vitamin D deficiency, unspecified: Secondary | ICD-10-CM | POA: Diagnosis not present

## 2022-10-25 DIAGNOSIS — I77811 Abdominal aortic ectasia: Secondary | ICD-10-CM | POA: Diagnosis not present

## 2022-10-25 DIAGNOSIS — N400 Enlarged prostate without lower urinary tract symptoms: Secondary | ICD-10-CM | POA: Diagnosis not present

## 2022-10-25 DIAGNOSIS — J432 Centrilobular emphysema: Secondary | ICD-10-CM

## 2022-10-25 DIAGNOSIS — I700 Atherosclerosis of aorta: Secondary | ICD-10-CM | POA: Diagnosis not present

## 2022-10-25 DIAGNOSIS — E782 Mixed hyperlipidemia: Secondary | ICD-10-CM

## 2022-10-26 ENCOUNTER — Other Ambulatory Visit: Payer: Self-pay | Admitting: Nurse Practitioner

## 2022-10-26 DIAGNOSIS — R972 Elevated prostate specific antigen [PSA]: Secondary | ICD-10-CM

## 2022-10-26 NOTE — Progress Notes (Signed)
Contacted via MyChart - please schedule 4 week lab only visit   Good afternoon Jim Bauer, your labs have returned: - Kidney function, creatinine and eGFR, remains normal, as is liver function, AST and ALT.  - A1c is in prediabetic range at 5.7%, prediabetes being 5.7 to 6.4%.  Please focus on healthy diet choices and regular activity. - CBC shows no infection or anemia. - Cholesterol levels remain stable, continue current medications. - Vitamin D and thyroid labs stable.  - PSA, prostate lab, is mildly elevated on these labs, a trend up.  However, it is in normal level for your age and ethnicity.  I would like to recheck in 4 weeks though to see if stable or increase from this.  I will have staff call to schedule lab only visit.  Any questions on this? Keep being awesome!!  Thank you for allowing me to participate in your care.  I appreciate you. Kindest regards, Zaiyah Sottile

## 2022-10-27 LAB — COMPREHENSIVE METABOLIC PANEL
ALT: 31 IU/L (ref 0–44)
AST: 30 IU/L (ref 0–40)
Albumin/Globulin Ratio: 2.9 — ABNORMAL HIGH (ref 1.2–2.2)
Albumin: 4.6 g/dL (ref 3.8–4.8)
Alkaline Phosphatase: 76 IU/L (ref 44–121)
BUN/Creatinine Ratio: 23 (ref 10–24)
BUN: 21 mg/dL (ref 8–27)
Bilirubin Total: 0.8 mg/dL (ref 0.0–1.2)
CO2: 23 mmol/L (ref 20–29)
Calcium: 9.6 mg/dL (ref 8.6–10.2)
Chloride: 106 mmol/L (ref 96–106)
Creatinine, Ser: 0.92 mg/dL (ref 0.76–1.27)
Globulin, Total: 1.6 g/dL (ref 1.5–4.5)
Glucose: 88 mg/dL (ref 70–99)
Potassium: 4.6 mmol/L (ref 3.5–5.2)
Sodium: 144 mmol/L (ref 134–144)
Total Protein: 6.2 g/dL (ref 6.0–8.5)
eGFR: 88 mL/min/{1.73_m2} (ref >59)

## 2022-10-27 LAB — CBC WITH DIFFERENTIAL/PLATELET
Basophils Absolute: 0 10*3/uL (ref 0.0–0.2)
Basos: 0 %
EOS (ABSOLUTE): 0.1 10*3/uL (ref 0.0–0.4)
Eos: 2 %
Hematocrit: 47.2 % (ref 37.5–51.0)
Hemoglobin: 15.9 g/dL (ref 13.0–17.7)
Immature Grans (Abs): 0 10*3/uL (ref 0.0–0.1)
Immature Granulocytes: 0 %
Lymphocytes Absolute: 2.2 10*3/uL (ref 0.7–3.1)
Lymphs: 42 %
MCH: 30 pg (ref 26.6–33.0)
MCHC: 33.7 g/dL (ref 31.5–35.7)
MCV: 89 fL (ref 79–97)
Monocytes Absolute: 0.5 10*3/uL (ref 0.1–0.9)
Monocytes: 9 %
Neutrophils Absolute: 2.5 10*3/uL (ref 1.4–7.0)
Neutrophils: 47 %
Platelets: 193 10*3/uL (ref 150–450)
RBC: 5.3 x10E6/uL (ref 4.14–5.80)
RDW: 13.7 % (ref 11.6–15.4)
WBC: 5.3 10*3/uL (ref 3.4–10.8)

## 2022-10-27 LAB — MICROALBUMIN / CREATININE URINE RATIO
Creatinine, Urine: 145.9 mg/dL
Microalb/Creat Ratio: 9 mg/g creat (ref 0–29)
Microalbumin, Urine: 13.7 ug/mL

## 2022-10-27 LAB — LIPID PANEL W/O CHOL/HDL RATIO
Cholesterol, Total: 163 mg/dL (ref 100–199)
HDL: 76 mg/dL (ref >39)
LDL Chol Calc (NIH): 73 mg/dL (ref 0–99)
Triglycerides: 72 mg/dL (ref 0–149)
VLDL Cholesterol Cal: 14 mg/dL (ref 5–40)

## 2022-10-27 LAB — HEMOGLOBIN A1C
Est. average glucose Bld gHb Est-mCnc: 117 mg/dL
Hgb A1c MFr Bld: 5.7 % — ABNORMAL HIGH (ref 4.8–5.6)

## 2022-10-27 LAB — VITAMIN D 25 HYDROXY (VIT D DEFICIENCY, FRACTURES): Vit D, 25-Hydroxy: 34.7 ng/mL (ref 30.0–100.0)

## 2022-10-27 LAB — SPECIMEN STATUS REPORT

## 2022-10-27 LAB — PSA: Prostate Specific Ag, Serum: 4.5 ng/mL — ABNORMAL HIGH (ref 0.0–4.0)

## 2022-10-27 LAB — TSH: TSH: 1.61 u[IU]/mL (ref 0.450–4.500)

## 2022-10-31 ENCOUNTER — Telehealth: Payer: Self-pay | Admitting: *Deleted

## 2022-10-31 MED ORDER — SUTAB 1479-225-188 MG PO TABS: ORAL_TABLET | ORAL | 0 refills | Status: DC

## 2022-10-31 NOTE — Telephone Encounter (Signed)
Had to go patient and reschedule his colonoscopy 11/27/2022. Dr Vicente Males will not be here.  New date is 12/04/2022. New instructions will be sent. Patient request Sutab to be sent to [facility].  Patient verbalized understanding.

## 2022-11-20 ENCOUNTER — Ambulatory Visit
Admission: RE | Admit: 2022-11-20 | Discharge: 2022-11-20 | Disposition: A | Discharge status: Home / Self Care | Payer: PPO | Source: Ambulatory Visit | Attending: Acute Care | Admitting: Acute Care

## 2022-11-20 DIAGNOSIS — I2510 Atherosclerotic heart disease of native coronary artery without angina pectoris: Secondary | ICD-10-CM | POA: Diagnosis not present

## 2022-11-20 DIAGNOSIS — F17210 Nicotine dependence, cigarettes, uncomplicated: Secondary | ICD-10-CM | POA: Diagnosis not present

## 2022-11-20 DIAGNOSIS — Z122 Encounter for screening for malignant neoplasm of respiratory organs: Secondary | ICD-10-CM | POA: Diagnosis not present

## 2022-11-20 DIAGNOSIS — J439 Emphysema, unspecified: Secondary | ICD-10-CM | POA: Diagnosis not present

## 2022-11-20 DIAGNOSIS — Z87891 Personal history of nicotine dependence: Secondary | ICD-10-CM

## 2022-11-20 DIAGNOSIS — I700 Atherosclerosis of aorta: Secondary | ICD-10-CM | POA: Insufficient documentation

## 2022-11-22 ENCOUNTER — Other Ambulatory Visit: Payer: PPO

## 2022-11-22 ENCOUNTER — Other Ambulatory Visit: Payer: Self-pay | Admitting: Acute Care

## 2022-11-22 DIAGNOSIS — R972 Elevated prostate specific antigen [PSA]: Secondary | ICD-10-CM

## 2022-11-22 DIAGNOSIS — Z87891 Personal history of nicotine dependence: Secondary | ICD-10-CM

## 2022-11-22 DIAGNOSIS — Z122 Encounter for screening for malignant neoplasm of respiratory organs: Secondary | ICD-10-CM

## 2022-11-22 DIAGNOSIS — F17210 Nicotine dependence, cigarettes, uncomplicated: Secondary | ICD-10-CM

## 2022-11-23 ENCOUNTER — Encounter: Payer: Self-pay | Admitting: Nurse Practitioner

## 2022-11-23 LAB — PSA, TOTAL AND FREE
PSA, Free Pct: 28 %
PSA, Free: 1.26 ng/mL
Prostate Specific Ag, Serum: 4.5 ng/mL — ABNORMAL HIGH (ref 0.0–4.0)

## 2022-11-23 NOTE — Progress Notes (Signed)
Contacted via [place] afternoon [name], your labs have returned and prostate lab remains elevated on reading, but for age normal is 0 to 5.2, so you are in range for age and ethnicity.  I recommend we recheck this next visit to ensure it does not trend up and perform prostate exam as I suspect prostate may be a little enlarged, which is common as men age.  Any questions? Keep being amazing!!  Thank you for allowing me to participate in your care.  I appreciate you. Kindest regards, Erasto Sleight

## 2022-12-01 ENCOUNTER — Encounter: Payer: Self-pay | Admitting: Gastroenterology

## 2022-12-04 ENCOUNTER — Ambulatory Visit: Payer: PPO | Admitting: Anesthesiology

## 2022-12-04 ENCOUNTER — Ambulatory Visit
Admission: RE | Admit: 2022-12-04 | Discharge: 2022-12-04 | Disposition: A | Discharge status: Home / Self Care | Payer: PPO | Attending: Gastroenterology | Admitting: Gastroenterology

## 2022-12-04 ENCOUNTER — Encounter: Payer: Self-pay | Admitting: Gastroenterology

## 2022-12-04 ENCOUNTER — Other Ambulatory Visit: Payer: Self-pay

## 2022-12-04 ENCOUNTER — Encounter
Admission: RE | Disposition: A | Discharge status: Home / Self Care | Payer: Self-pay | Source: Home / Self Care | Attending: Gastroenterology

## 2022-12-04 DIAGNOSIS — I10 Essential (primary) hypertension: Secondary | ICD-10-CM | POA: Insufficient documentation

## 2022-12-04 DIAGNOSIS — Z86010 Personal history of colonic polyps: Secondary | ICD-10-CM | POA: Diagnosis not present

## 2022-12-04 DIAGNOSIS — Z8379 Family history of other diseases of the digestive system: Secondary | ICD-10-CM | POA: Diagnosis not present

## 2022-12-04 DIAGNOSIS — Z8249 Family history of ischemic heart disease and other diseases of the circulatory system: Secondary | ICD-10-CM | POA: Insufficient documentation

## 2022-12-04 DIAGNOSIS — E785 Hyperlipidemia, unspecified: Secondary | ICD-10-CM | POA: Insufficient documentation

## 2022-12-04 DIAGNOSIS — F17210 Nicotine dependence, cigarettes, uncomplicated: Secondary | ICD-10-CM | POA: Diagnosis not present

## 2022-12-04 DIAGNOSIS — J432 Centrilobular emphysema: Secondary | ICD-10-CM | POA: Insufficient documentation

## 2022-12-04 DIAGNOSIS — I2510 Atherosclerotic heart disease of native coronary artery without angina pectoris: Secondary | ICD-10-CM | POA: Insufficient documentation

## 2022-12-04 DIAGNOSIS — K5730 Diverticulosis of large intestine without perforation or abscess without bleeding: Secondary | ICD-10-CM | POA: Diagnosis not present

## 2022-12-04 DIAGNOSIS — Z1211 Encounter for screening for malignant neoplasm of colon: Secondary | ICD-10-CM | POA: Insufficient documentation

## 2022-12-04 HISTORY — PX: COLONOSCOPY WITH PROPOFOL: SHX5780

## 2022-12-04 SURGERY — COLONOSCOPY WITH PROPOFOL
Anesthesia: General

## 2022-12-04 MED ORDER — PROPOFOL 10 MG/ML IV BOLUS
INTRAVENOUS | Status: AC
  Filled 2022-12-04: qty 40

## 2022-12-04 MED ORDER — LIDOCAINE HCL (CARDIAC) PF 100 MG/5ML IV SOSY
PREFILLED_SYRINGE | INTRAVENOUS | Status: DC | PRN
  Administered 2022-12-04: 100 mg via INTRAVENOUS

## 2022-12-04 MED ORDER — SODIUM CHLORIDE 0.9 % IV SOLN: INTRAVENOUS | Status: DC

## 2022-12-04 MED ORDER — PROPOFOL 10 MG/ML IV BOLUS
INTRAVENOUS | Status: DC | PRN
  Administered 2022-12-04: 110 ug/kg/min via INTRAVENOUS
  Administered 2022-12-04: 100 mg via INTRAVENOUS

## 2022-12-04 NOTE — Anesthesia Preprocedure Evaluation (Signed)
Anesthesia Evaluation  Patient identified by MRN, date of birth, ID band Patient awake    Reviewed: Allergy & Precautions, NPO status , Patient's Chart, lab work & pertinent test results  History of Anesthesia Complications Negative for: history of anesthetic complications  Airway Mallampati: III  TM Distance: <3 FB Neck ROM: full    Dental  (+) Chipped, Poor Dentition, Missing   Pulmonary shortness of breath, COPD, Current Smoker and Patient abstained from smoking.   Pulmonary exam normal        Cardiovascular Exercise Tolerance: Good hypertension, (-) angina + CAD  Normal cardiovascular exam     Neuro/Psych negative neurological ROS  negative psych ROS   GI/Hepatic negative GI ROS, Neg liver ROS,neg GERD  ,,  Endo/Other  negative endocrine ROS    Renal/GU negative Renal ROS  negative genitourinary   Musculoskeletal   Abdominal   Peds  Hematology negative hematology ROS (+)   Anesthesia Other Findings Past Medical History: 2015: Allergy 10/28/2019: Centrilobular emphysema ([place]) No date: Essential hypertension No date: Hyperlipidemia 08/20/2015: Personal history of tobacco use, presenting hazards to  health  Past Surgical History: No date: COLONOSCOPY 06/21/2015: COLONOSCOPY WITH PROPOFOL; N/A     Comment:  Procedure: COLONOSCOPY WITH PROPOFOL;  Surgeon: Lollie Sails, MD;  Location: [facility] ENDOSCOPY;  Service:               Endoscopy;  Laterality: N/A; 05/07/2019: COLONOSCOPY WITH PROPOFOL; N/A     Comment:  Procedure: COLONOSCOPY WITH PROPOFOL;  Surgeon: Jonathon Bellows, MD;  Location: [facility] ENDOSCOPY;  Service:               Gastroenterology;  Laterality: N/A; No date: KNEE ARTHROSCOPY No date: TONSILLECTOMY  BMI    Body Mass Index: 28.82 kg/m      Reproductive/Obstetrics negative OB ROS                              Anesthesia Physical Anesthesia  Plan  ASA: 3  Anesthesia Plan: General   Post-op Pain Management:    Induction: Intravenous  PONV Risk Score and Plan: Propofol infusion and TIVA  Airway Management Planned: Natural Airway and Nasal Cannula  Additional Equipment:   Intra-op Plan:   Post-operative Plan:   Informed Consent: I have reviewed the patients History and Physical, chart, labs and discussed the procedure including the risks, benefits and alternatives for the proposed anesthesia with the patient or authorized representative who has indicated his/her understanding and acceptance.     Dental Advisory Given  Plan Discussed with: Anesthesiologist, CRNA and Surgeon  Anesthesia Plan Comments: (Patient consented for risks of anesthesia including but not limited to:  - adverse reactions to medications - risk of airway placement if required - damage to eyes, teeth, lips or other oral mucosa - nerve damage due to positioning  - sore throat or hoarseness - Damage to heart, brain, nerves, lungs, other parts of body or loss of life  Patient voiced understanding.)         Anesthesia Quick Evaluation

## 2022-12-04 NOTE — H&P (Signed)
Jonathon Bellows, MD [address] [place], [place], [place], [address] [address], [place], [place], [place], [address] Phone: [phone]  Fax: [phone]  Primary Care Physician:  Venita Lick, NP   Pre-Procedure History & Physical: HPI:  Jim Bauer is a 72 y.o. male is here for an colonoscopy.   Past Medical History:  Diagnosis Date   Allergy 2015   Centrilobular emphysema ([place]) 10/28/2019   Essential hypertension    Hyperlipidemia    Personal history of tobacco use, presenting hazards to health 08/20/2015    Past Surgical History:  Procedure Laterality Date   COLONOSCOPY     COLONOSCOPY WITH PROPOFOL N/A 06/21/2015   Procedure: COLONOSCOPY WITH PROPOFOL;  Surgeon: Lollie Sails, MD;  Location: [facility] ENDOSCOPY;  Service: Endoscopy;  Laterality: N/A;   COLONOSCOPY WITH PROPOFOL N/A 05/07/2019   Procedure: COLONOSCOPY WITH PROPOFOL;  Surgeon: Jonathon Bellows, MD;  Location: [facility] ENDOSCOPY;  Service: Gastroenterology;  Laterality: N/A;   COLONOSCOPY WITH PROPOFOL N/A 05/15/2022   Procedure: COLONOSCOPY WITH PROPOFOL;  Surgeon: Jonathon Bellows, MD;  Location: [facility] ENDOSCOPY;  Service: Gastroenterology;  Laterality: N/A;   KNEE ARTHROSCOPY     TONSILLECTOMY      Prior to Admission medications   Medication Sig Start Date End Date Taking? Authorizing Provider  aspirin EC 81 MG tablet Take 81 mg by mouth daily.    [provider]  Cholecalciferol (D2000 ULTRA STRENGTH) 50 MCG (2000 UT) CAPS Take 2,000 Units by mouth daily.    [provider]  EPINEPHrine 0.3 mg/0.3 mL IJ SOAJ injection Inject 0.3 mg into the muscle as needed for anaphylaxis. 04/01/21   Cannady, Jolene T, NP  Omega 3 1000 MG CAPS Take 1 capsule by mouth daily.    [provider]  rosuvastatin (CRESTOR) 40 MG tablet Take 1 tablet (40 mg total) by mouth daily. 04/10/22   Marnee Guarneri T, NP  Sodium Sulfate-Mag Sulfate-KCl (SUTAB) [phone] MG TABS Take 1 kit by mouth once as  instructed 10/31/22   Jonathon Bellows, MD    Allergies as of 07/27/2022 - Review Complete 05/15/2022  Allergen Reaction Noted   Bee venom Swelling 06/21/2015    Family History  Problem Relation Age of Onset   Macular degeneration Mother    Diabetes Father    Parkinson's disease Father    Hypertension Father    Obesity Brother    Sleep apnea Brother    Melanoma Daughter    Diverticulitis Maternal Grandmother    Alcohol abuse Maternal Grandfather    Heart disease Paternal Grandmother    Stroke Paternal Grandfather     Social History   Socioeconomic History   Marital status: Widowed    Spouse name: Not on file   Number of children: Not on file   Years of education: Not on file   Highest education level: Not on file  Occupational History   Occupation: retired  Tobacco Use   Smoking status: Every Day    Packs/day: 0.50    Years: 43.00    Additional pack years: 0.00    Total pack years: 21.50    Types: Cigarettes   Smokeless tobacco: Never  Vaping Use   Vaping Use: Never used  Substance and Sexual Activity   Alcohol use: Yes    Alcohol/week: 14.0 standard drinks of alcohol    Types: 14 Glasses of wine per week    Comment: daily   Drug use: Never   Sexual activity: Not Currently    Birth  control/protection: None  Other Topics Concern   Not on file  Social History Narrative   Not on file   Social Determinants of Health   Financial Resource Strain: Low Risk  (09/27/2020)   Overall Financial Resource Strain (CARDIA)    Difficulty of Paying Living Expenses: Not hard at all  Food Insecurity: No Food Insecurity (09/27/2020)   Hunger Vital Sign    Worried About Running Out of Food in the Last Year: Never true    Ran Out of Food in the Last Year: Never true  Transportation Needs: No Transportation Needs (09/27/2020)   PRAPARE - Hydrologist (Medical): No    Lack of Transportation (Non-Medical): No  Physical Activity: Sufficiently Active  (09/27/2020)   Exercise Vital Sign    Days of Exercise per Week: 3 days    Minutes of Exercise per Session: 150+ min  Stress: No Stress Concern Present (09/27/2020)   [place]    Feeling of Stress : Not at all  Social Connections: Not on file  Intimate Partner Violence: Not on file    Review of Systems: See HPI, otherwise negative ROS  Physical Exam: There were no vitals taken for this visit. General:   Alert,  pleasant and cooperative in NAD Head:  Normocephalic and atraumatic. Neck:  Supple; no masses or thyromegaly. Lungs:  Clear throughout to auscultation, normal respiratory effort.    Heart:  +S1, +S2, Regular rate and rhythm, No edema. Abdomen:  Soft, nontender and nondistended. Normal bowel sounds, without guarding, and without rebound.   Neurologic:  Alert and  oriented x4;  grossly normal neurologically.  Impression/Plan: Jim Bauer is here for an colonoscopy to be performed for surveillance due to prior history of colon polyps   Risks, benefits, limitations, and alternatives regarding  colonoscopy have been reviewed with the patient.  Questions have been answered.  All parties agreeable.   Jonathon Bellows, MD  12/04/2022, 8:39 AM

## 2022-12-04 NOTE — Op Note (Signed)
[facility] Patient Name: Markjoseph Chee Procedure Date: 12/04/2022 9:14 AM MRN: [id number] Account #: [id number] Date of Birth: Nov 10, 1950 Admit Type: Outpatient Age: 72 Room: [facility] ENDO ROOM 1 Gender: Male Note Status: Finalized Instrument Name: Colonoscope [id number] Procedure:             Colonoscopy Indications:           Surveillance: Piecemeal removal of large sessile                         adenoma last colonoscopy (< 3 yrs), Last colonoscopy:                         August 2023 Providers:             Jonathon Bellows MD, MD Referring MD:          Barbaraann Faster. Ned Card (Referring MD) Medicines:             Monitored Anesthesia Care Complications:         No immediate complications. Procedure:             Pre-Anesthesia Assessment:                        - Prior to the procedure, a History and Physical was                         performed, and patient medications, allergies and                         sensitivities were reviewed. The patient's tolerance                         of previous anesthesia was reviewed.                        - The risks and benefits of the procedure and the                         sedation options and risks were discussed with the                         patient. All questions were answered and informed                         consent was obtained.                        - ASA Grade Assessment: II - A patient with mild                         systemic disease.                        After obtaining informed consent, the colonoscope was                         passed under direct vision. Throughout the procedure,                         the patient's blood pressure,  pulse, and oxygen                         saturations were monitored continuously. The                         Colonoscope was introduced through the anus and                         advanced to the the cecum, identified by the                         appendiceal  orifice. The colonoscopy was performed                         with ease. The patient tolerated the procedure well.                         The quality of the bowel preparation was good. The                         ileocecal valve, appendiceal orifice, and rectum were                         photographed. Findings:      Multiple medium-mouthed diverticula were found in the sigmoid colon.      The exam was otherwise without abnormality on direct and retroflexion       views. Impression:            - Diverticulosis in the sigmoid colon.                        - The examination was otherwise normal on direct and                         retroflexion views.                        - No specimens collected. Recommendation:        - Discharge patient to home (with escort).                        - Resume previous diet.                        - Continue present medications.                        - Repeat colonoscopy in 1 year for surveillance. Procedure Code(s):     --- Professional ---                        [phone], Colonoscopy, flexible; diagnostic, including                         collection of specimen(s) by brushing or washing, when                         performed (separate procedure) Diagnosis Code(s):     --- Professional ---  Z86.010, Personal history of colonic polyps                        K57.30, Diverticulosis of large intestine without                         perforation or abscess without bleeding CPT copyright 2022 American Medical Association. All rights reserved. The codes documented in this report are preliminary and upon coder review may  be revised to meet current compliance requirements. Jonathon Bellows, MD Jonathon Bellows MD, MD 12/04/2022 9:34:36 AM This report has been signed electronically. Number of Addenda: 0 Note Initiated On: 12/04/2022 9:14 AM Scope Withdrawal Time: 0 hours 9 minutes 46 seconds  Total Procedure Duration: 0 hours 12 minutes 14  seconds  Estimated Blood Loss:  Estimated blood loss: none.      [facility]

## 2022-12-04 NOTE — Transfer of Care (Signed)
Immediate Anesthesia Transfer of Care Note  Patient: Jim Bauer  Procedure(s) Performed: COLONOSCOPY WITH PROPOFOL  Patient Location: PACU  Anesthesia Type:General  Level of Consciousness: drowsy  Airway & Oxygen Therapy: Patient Spontanous Breathing  Post-op Assessment: Report given to RN and Post -op Vital signs reviewed and stable  Post vital signs: Reviewed and stable  Last Vitals:  Vitals Value Taken Time  BP 123/71 0936  Temp 35.9 0936  Pulse 58 0936  Resp 15 0936  SpO2 97 0936    Last Pain:  Vitals:   12/04/22 0900  TempSrc: Temporal  PainSc: 0-No pain         Complications: No notable events documented.

## 2022-12-04 NOTE — Anesthesia Postprocedure Evaluation (Signed)
Anesthesia Post Note  Patient: Jim Bauer  Procedure(s) Performed: COLONOSCOPY WITH PROPOFOL  Patient location during evaluation: Endoscopy Anesthesia Type: General Level of consciousness: awake and alert Pain management: pain level controlled Vital Signs Assessment: post-procedure vital signs reviewed and stable Respiratory status: spontaneous breathing, nonlabored ventilation, respiratory function stable and patient connected to nasal cannula oxygen Cardiovascular status: blood pressure returned to baseline and stable Postop Assessment: no apparent nausea or vomiting Anesthetic complications: no   No notable events documented.   Last Vitals:  Vitals:   12/04/22 0947 12/04/22 0956  BP: (!) 164/86 (!) 161/83  Pulse: (!) 56 (!) 57  Resp: 10 13  Temp:    SpO2: 100% 100%    Last Pain:  Vitals:   12/04/22 0956  TempSrc:   PainSc: 0-No pain                 Ilene Qua

## 2022-12-05 ENCOUNTER — Encounter: Payer: Self-pay | Admitting: Gastroenterology

## 2023-01-31 DIAGNOSIS — L298 Other pruritus: Secondary | ICD-10-CM | POA: Diagnosis not present

## 2023-01-31 DIAGNOSIS — Z86018 Personal history of other benign neoplasm: Secondary | ICD-10-CM | POA: Diagnosis not present

## 2023-01-31 DIAGNOSIS — Z872 Personal history of diseases of the skin and subcutaneous tissue: Secondary | ICD-10-CM | POA: Diagnosis not present

## 2023-01-31 DIAGNOSIS — L578 Other skin changes due to chronic exposure to nonionizing radiation: Secondary | ICD-10-CM | POA: Diagnosis not present

## 2023-01-31 DIAGNOSIS — L821 Other seborrheic keratosis: Secondary | ICD-10-CM | POA: Diagnosis not present

## 2023-01-31 DIAGNOSIS — L570 Actinic keratosis: Secondary | ICD-10-CM | POA: Diagnosis not present

## 2023-02-01 ENCOUNTER — Telehealth: Payer: Self-pay | Admitting: Nurse Practitioner

## 2023-02-01 NOTE — Telephone Encounter (Signed)
Patient came into the office to request a short form for Medical Clearance for [facility] to be completed by [employer]. Placed form in providers box. Patient is requesting phone call when completed.

## 2023-02-02 NOTE — Telephone Encounter (Signed)
Paperwork signed and placed in complete bin ready for pick up. Patient aware

## 2023-04-15 DIAGNOSIS — R972 Elevated prostate specific antigen [PSA]: Secondary | ICD-10-CM | POA: Insufficient documentation

## 2023-04-15 NOTE — Patient Instructions (Signed)
Be Involved in Caring For Your Health:  Taking Medications When medications are taken as directed, they can greatly improve your health. But if they are not taken as prescribed, they may not work. In some cases, not taking them correctly can be harmful. To help ensure your treatment remains effective and safe, understand your medications and how to take them. Bring your medications to each visit for review by your provider.  Your lab results, notes, and after visit summary will be available on My Chart. We strongly encourage you to use this feature. If lab results are abnormal the clinic will contact you with the appropriate steps. If the clinic does not contact you assume the results are satisfactory. You can always view your results on My Chart. If you have questions regarding your health or results, please contact the clinic during office hours. You can also ask questions on My Chart.  We at [facility] are grateful that you chose us to provide your care. We strive to provide evidence-based and compassionate care and are always looking for feedback. If you get a survey from the clinic please complete this so we can hear your opinions.  DASH Eating Plan DASH stands for Dietary Approaches to Stop Hypertension. The DASH eating plan is a healthy eating plan that has been shown to: Lower high blood pressure (hypertension). Reduce your risk for type 2 diabetes, heart disease, and stroke. Help with weight loss. What are tips for following this plan? Reading food labels Check food labels for the amount of salt (sodium) per serving. Choose foods with less than 5 percent of the Daily Value (DV) of sodium. In general, foods with less than 300 milligrams (mg) of sodium per serving fit into this eating plan. To find whole grains, look for the word "whole" as the first word in the ingredient list. Shopping Buy products labeled as "low-sodium" or "no salt added." Buy fresh foods. Avoid canned  foods and pre-made or frozen meals. Cooking Try not to add salt when you cook. Use salt-free seasonings or herbs instead of table salt or sea salt. Check with your health care provider or pharmacist before using salt substitutes. Do not fry foods. Cook foods in healthy ways, such as baking, boiling, grilling, roasting, or broiling. Cook using oils that are good for your heart. These include olive, canola, avocado, soybean, and sunflower oil. Meal planning  Eat a balanced diet. This should include: 4 or more servings of fruits and 4 or more servings of vegetables each day. Try to fill half of your plate with fruits and vegetables. 6-8 servings of whole grains each day. 6 or less servings of lean meat, poultry, or fish each day. 1 oz is 1 serving. A 3 oz (85 g) serving of meat is about the same size as the palm of your hand. One egg is 1 oz (28 g). 2-3 servings of low-fat dairy each day. One serving is 1 cup (237 mL). 1 serving of nuts, seeds, or beans 5 times each week. 2-3 servings of heart-healthy fats. Healthy fats called omega-3 fatty acids are found in foods such as walnuts, flaxseeds, fortified milks, and eggs. These fats are also found in cold-water fish, such as sardines, salmon, and mackerel. Limit how much you eat of: Canned or prepackaged foods. Food that is high in trans fat, such as fried foods. Food that is high in saturated fat, such as fatty meat. Desserts and other sweets, sugary drinks, and other foods with added sugar. Full-fat   dairy products. Do not salt foods before eating. Do not eat more than 4 egg yolks a week. Try to eat at least 2 vegetarian meals a week. Eat more home-cooked food and less restaurant, buffet, and fast food. Lifestyle When eating at a restaurant, ask if your food can be made with less salt or no salt. If you drink alcohol: Limit how much you have to: 0-1 drink a day if you are male. 0-2 drinks a day if you are male. Know how much alcohol is in  your drink. In the [place] one drink is one 12 oz bottle of beer (355 mL), one 5 oz glass of wine (148 mL), or one 1 oz glass of hard liquor (44 mL). General information Avoid eating more than 2,300 mg of salt a day. If you have hypertension, you may need to reduce your sodium intake to 1,500 mg a day. Work with your provider to stay at a healthy body weight or lose weight. Ask what the best weight range is for you. On most days of the week, get at least 30 minutes of exercise that causes your heart to beat faster. This may include walking, swimming, or biking. Work with your provider or dietitian to adjust your eating plan to meet your specific calorie needs. What foods should I eat? Fruits All fresh, dried, or frozen fruit. Canned fruits that are in their natural juice and do not have sugar added to them. Vegetables Fresh or frozen vegetables that are raw, steamed, roasted, or grilled. Low-sodium or reduced-sodium tomato and vegetable juice. Low-sodium or reduced-sodium tomato sauce and tomato paste. Low-sodium or reduced-sodium canned vegetables. Grains Whole-grain or whole-wheat bread. Whole-grain or whole-wheat pasta. Brown rice. Oatmeal. Quinoa. Bulgur. Whole-grain and low-sodium cereals. Pita bread. Low-fat, low-sodium crackers. Whole-wheat flour tortillas. Meats and other proteins Skinless chicken or turkey. Ground chicken or turkey. Pork with fat trimmed off. Fish and seafood. Egg whites. Dried beans, peas, or lentils. Unsalted nuts, nut butters, and seeds. Unsalted canned beans. Lean cuts of beef with fat trimmed off. Low-sodium, lean precooked or cured meat, such as sausages or meat loaves. Dairy Low-fat (1%) or fat-free (skim) milk. Reduced-fat, low-fat, or fat-free cheeses. Nonfat, low-sodium ricotta or cottage cheese. Low-fat or nonfat yogurt. Low-fat, low-sodium cheese. Fats and oils Soft margarine without trans fats. Vegetable oil. Reduced-fat, low-fat, or light mayonnaise and salad  dressings (reduced-sodium). Canola, safflower, olive, avocado, soybean, and sunflower oils. Avocado. Seasonings and condiments Herbs. Spices. Seasoning mixes without salt. Other foods Unsalted popcorn and pretzels. Fat-free sweets. The items listed above may not be all the foods and drinks you can have. Talk to a dietitian to learn more. What foods should I avoid? Fruits Canned fruit in a light or heavy syrup. Fried fruit. Fruit in cream or butter sauce. Vegetables Creamed or fried vegetables. Vegetables in a cheese sauce. Regular canned vegetables that are not marked as low-sodium or reduced-sodium. Regular canned tomato sauce and paste that are not marked as low-sodium or reduced-sodium. Regular tomato and vegetable juices that are not marked as low-sodium or reduced-sodium. Pickles. Olives. Grains Baked goods made with fat, such as croissants, muffins, or some breads. Dry pasta or rice meal packs. Meats and other proteins Fatty cuts of meat. Ribs. Fried meat. Bacon. Bologna, salami, and other precooked or cured meats, such as sausages or meat loaves, that are not lean and low in sodium. Fat from the back of a pig (fatback). Bratwurst. Salted nuts and seeds. Canned beans with added salt. Canned   or smoked fish. Whole eggs or egg yolks. Chicken or turkey with skin. Dairy Whole or 2% milk, cream, and half-and-half. Whole or full-fat cream cheese. Whole-fat or sweetened yogurt. Full-fat cheese. Nondairy creamers. Whipped toppings. Processed cheese and cheese spreads. Fats and oils Butter. Stick margarine. Lard. Shortening. Ghee. Bacon fat. Tropical oils, such as coconut, palm kernel, or palm oil. Seasonings and condiments Onion salt, garlic salt, seasoned salt, table salt, and sea salt. Worcestershire sauce. Tartar sauce. Barbecue sauce. Teriyaki sauce. Soy sauce, including reduced-sodium soy sauce. Steak sauce. Canned and packaged gravies. Fish sauce. Oyster sauce. Cocktail sauce. Store-bought  horseradish. Ketchup. Mustard. Meat flavorings and tenderizers. Bouillon cubes. Hot sauces. Pre-made or packaged marinades. Pre-made or packaged taco seasonings. Relishes. Regular salad dressings. Other foods Salted popcorn and pretzels. The items listed above may not be all the foods and drinks you should avoid. Talk to a dietitian to learn more. Where to find more information National Heart, Lung, and Blood Institute (NHLBI): nhlbi.nih.gov American Heart Association (AHA): heart.org Academy of Nutrition and Dietetics: eatright.org National Kidney Foundation (NKF): kidney.org This information is not intended to replace advice given to you by your health care provider. Make sure you discuss any questions you have with your health care provider. Document Revised: 09/21/2022 Document Reviewed: 09/21/2022 Elsevier Patient Education  2024 Elsevier Inc.  

## 2023-04-18 ENCOUNTER — Ambulatory Visit (INDEPENDENT_AMBULATORY_CARE_PROVIDER_SITE_OTHER): Payer: PPO | Admitting: Nurse Practitioner

## 2023-04-18 ENCOUNTER — Encounter: Payer: Self-pay | Admitting: Nurse Practitioner

## 2023-04-18 VITALS — BP 118/70 | HR 55 | Ht 69.5 in | Wt 210.2 lb

## 2023-04-18 DIAGNOSIS — R972 Elevated prostate specific antigen [PSA]: Secondary | ICD-10-CM | POA: Diagnosis not present

## 2023-04-18 DIAGNOSIS — R7303 Prediabetes: Secondary | ICD-10-CM | POA: Diagnosis not present

## 2023-04-18 DIAGNOSIS — E6609 Other obesity due to excess calories: Secondary | ICD-10-CM

## 2023-04-18 DIAGNOSIS — I10 Essential (primary) hypertension: Secondary | ICD-10-CM | POA: Diagnosis not present

## 2023-04-18 DIAGNOSIS — J432 Centrilobular emphysema: Secondary | ICD-10-CM | POA: Diagnosis not present

## 2023-04-18 DIAGNOSIS — I77811 Abdominal aortic ectasia: Secondary | ICD-10-CM | POA: Diagnosis not present

## 2023-04-18 DIAGNOSIS — Z6830 Body mass index (BMI) 30.0-30.9, adult: Secondary | ICD-10-CM | POA: Diagnosis not present

## 2023-04-18 DIAGNOSIS — F17210 Nicotine dependence, cigarettes, uncomplicated: Secondary | ICD-10-CM

## 2023-04-18 DIAGNOSIS — E782 Mixed hyperlipidemia: Secondary | ICD-10-CM

## 2023-04-18 DIAGNOSIS — Z0000 Encounter for general adult medical examination without abnormal findings: Secondary | ICD-10-CM

## 2023-04-18 DIAGNOSIS — I2510 Atherosclerotic heart disease of native coronary artery without angina pectoris: Secondary | ICD-10-CM

## 2023-04-18 DIAGNOSIS — I700 Atherosclerosis of aorta: Secondary | ICD-10-CM

## 2023-04-18 LAB — BAYER DCA HB A1C WAIVED: HB A1C (BAYER DCA - WAIVED): 5.6 % (ref 4.8–5.6)

## 2023-04-18 MED ORDER — ROSUVASTATIN CALCIUM 40 MG PO TABS: 40.0000 mg | ORAL_TABLET | Freq: Every day | ORAL | 4 refills | Status: DC

## 2023-04-18 NOTE — Assessment & Plan Note (Signed)
Recheck today -- is in normal range for his age and ethnicity.  Suspect some BPH present.

## 2023-04-18 NOTE — Assessment & Plan Note (Signed)
Chronic, stable.  Continue current medication regimen and adjust as needed.  Check lipid panel, last LDL >70, may adjust statin dose if ongoing elevation.

## 2023-04-18 NOTE — Assessment & Plan Note (Signed)
Ongoing noted on imaging.  Continue to monitor BP and ensure good control.  Recommend continue ASA and statin daily, adjust as needed + continue cessation smoking.  Repeat in August 2027.

## 2023-04-18 NOTE — Assessment & Plan Note (Signed)
Noted on Lung CT screening.  Recommend continue cessation of smoking.  Continue daily statin and ASA for prevention.

## 2023-04-18 NOTE — Assessment & Plan Note (Signed)
BMI 30.60.  Recommended eating smaller high protein, low fat meals more frequently and exercising 30 mins a day 5 times a week with a goal of 10-15lb weight loss in the next 3 months. Patient voiced their understanding and motivation to adhere to these recommendations.

## 2023-04-18 NOTE — Assessment & Plan Note (Signed)
Chronic, ongoing BP at goal at home -- he documents daily and brings to all visits with stable levels.  Repeat today at goal.  Will continue focus on DASH diet at home and recommend complete smoking cessation.  Initiate medication as needed if home readings elevated -- consider ARB if needed in future due to mild proteinuria past labs. Labs today: CMP.  Return in 6 months.

## 2023-04-18 NOTE — Assessment & Plan Note (Signed)
Last A1c 5.7% and urine ALB 14.  Continue diet focus and consider ARB initiation in future.

## 2023-04-18 NOTE — Assessment & Plan Note (Signed)
Ongoing.  Noted on CT lung CA screening.  Spirometry January 2023 remains stable FEV1 104% and FEV1/FVC 109%.  Currently no symptoms and no inhaler regimen.  Will plan to initiate inhaler regimen as needed and recommend continue cessation of smoking.  Continue yearly lung screening and spirometry.

## 2023-04-18 NOTE — Progress Notes (Signed)
Contacted via MyChart   A1c continues to look great with no diabetes or prediabetes!!  [place] job!!

## 2023-04-18 NOTE — Progress Notes (Signed)
BP 118/70 (BP Location: Left Arm, Patient Position: Sitting, Cuff Size: Normal)   Pulse (!) 55   Ht 5' 9.5" (1.765 m)   Wt 210 lb 3.2 oz (95.3 kg)   SpO2 96%   BMI 30.60 kg/m    Subjective:    Patient ID: Jim Bauer, male    DOB: September 23, 1950, 72 y.o.   MRN: [id number]  HPI: Jim Bauer is a 72 y.o. male  Chief Complaint  Patient presents with   Hypertension   Hyperlipidemia   Medication Refill    Patient is requesting a refill on his Cholesterol medication at today's visit.    HYPERTENSION / HYPERLIPIDEMIA Taking Rosuvastatin 40 MG and ASA, no current BP medications.     AAA screening December 2016 noted "Abdominal aortic ectasia 2.8 cm. Ectatic abdominal aorta at risk for aneurysm development. Recommend follow-up by ultrasound in 5 years".  On repeat 04/25/2021 noted ongoing measurement of 2.8 cm to perform 5 year follow-up.   Satisfied with current treatment? yes Duration of hypertension: chronic BP monitoring frequency: daily BP range:104/57 to 124/62 and HR average 60 BP medication side effects: no Duration of hyperlipidemia: chronic Cholesterol medication side effects: no, Cholesterol supplements: fish oil Medication compliance: good compliance Aspirin: yes Recent stressors: no Recurrent headaches: no Visual changes: no Palpitations: no Dyspnea: no Chest pain: no Lower extremity edema: no Dizzy/lightheaded: no   COPD Smoking almost none -- a friend of his helped him cut back -- only time he smokes now is when playing golf, has maybe smoked a pack in last 4 months.  Has smoked since age 30. CT lung screening noted mild centrilobular emphysema and aortic atherosclerosis, last screening 11/18/2021. COPD status: stable Satisfied with current treatment?: yes Oxygen use: no Dyspnea frequency: none Cough frequency: this has decreased since cutting back on smoking Rescue inhaler frequency:  no Limitation of activity: no Productive cough: none Last Spirometry:  10/10/21 FEV1 104% and FEV1/FVC 105% Pneumovax: Up to Date Influenza: Up to Date     04/18/2023   10:26 AM 10/20/2022    8:32 AM 04/10/2022    8:42 AM 10/10/2021    8:53 AM 09/29/2020    9:12 AM  Depression screen PHQ 2/9  Decreased Interest 0 0 0 0 0  Down, Depressed, Hopeless 0 0 0 0 0  PHQ - 2 Score 0 0 0 0 0  Altered sleeping 0 0 0 0   Tired, decreased energy 0 0 1 1   Change in appetite 0 0 0 0   Feeling bad or failure about yourself  0 0 0 0   Trouble concentrating 0 0 0 0   Moving slowly or fidgety/restless 0 0 0 0   Suicidal thoughts 0 0 0 0   PHQ-9 Score 0 0 1 1   Difficult doing work/chores Not difficult at all Not difficult at all Not difficult at all Not difficult at all        04/18/2023   10:26 AM 04/10/2022    8:43 AM 10/10/2021    8:52 AM 06/18/2019   10:54 AM  GAD 7 : Generalized Anxiety Score  Nervous, Anxious, on Edge 0 0 1 0  Control/stop worrying 0 0 0 1  Worry too much - different things 0 0 0 0  Trouble relaxing 0 0 0 1  Restless 0 0 0 0  Easily annoyed or irritable 0 0 0 0  Afraid - awful might happen 0 0 1 1  Total GAD 7 Score  0 0 2 3  Anxiety Difficulty Not difficult at all Not difficult at all Not difficult at all Not difficult at all    Relevant past medical, surgical, family and social history reviewed and updated as indicated. Interim medical history since our last visit reviewed. Allergies and medications reviewed and updated.  Review of Systems  Constitutional:  Negative for activity change, diaphoresis, fatigue and fever.  Respiratory:  Negative for cough, chest tightness, shortness of breath and wheezing.   Cardiovascular:  Negative for chest pain, palpitations and leg swelling.  Gastrointestinal: Negative.   Neurological: Negative.   Psychiatric/Behavioral: Negative.     Per HPI unless specifically indicated above     Objective:    BP 118/70 (BP Location: Left Arm, Patient Position: Sitting, Cuff Size: Normal)   Pulse (!) 55   Ht 5'  9.5" (1.765 m)   Wt 210 lb 3.2 oz (95.3 kg)   SpO2 96%   BMI 30.60 kg/m   Wt Readings from Last 3 Encounters:  04/18/23 210 lb 3.2 oz (95.3 kg)  12/04/22 203 lb (92.1 kg)  05/15/22 198 lb (89.8 kg)    Physical Exam Vitals and nursing note reviewed.  Constitutional:      General: He is awake. He is not in acute distress.    Appearance: He is well-developed, well-groomed and overweight. He is not ill-appearing.  HENT:     Head: Normocephalic and atraumatic.     Right Ear: Hearing normal. No drainage.     Left Ear: Hearing normal. No drainage.  Eyes:     General: Lids are normal.        Right eye: No discharge.        Left eye: No discharge.     Conjunctiva/sclera: Conjunctivae normal.     Pupils: Pupils are equal, round, and reactive to light.  Neck:     Thyroid: No thyromegaly.     Vascular: No carotid bruit.     Trachea: Trachea normal.  Cardiovascular:     Rate and Rhythm: Regular rhythm. Bradycardia present.     Heart sounds: Normal heart sounds, S1 normal and S2 normal. No murmur heard.    No gallop.  Pulmonary:     Effort: Pulmonary effort is normal. No accessory muscle usage or respiratory distress.     Breath sounds: Normal breath sounds.  Abdominal:     General: Bowel sounds are normal.     Palpations: Abdomen is soft. There is no hepatomegaly or splenomegaly.  Musculoskeletal:        General: Normal range of motion.     Cervical back: Normal range of motion and neck supple.     Right lower leg: No edema.     Left lower leg: No edema.  Skin:    General: Skin is warm and dry.     Capillary Refill: Capillary refill takes less than 2 seconds.  Neurological:     Mental Status: He is alert and oriented to person, place, and time.  Psychiatric:        Attention and Perception: Attention normal.        Mood and Affect: Mood normal.        Speech: Speech normal.        Behavior: Behavior normal. Behavior is cooperative.        Thought Content: Thought content  normal.    Results for orders placed or performed in visit on 11/22/22  PSA, total and free  Result Value Ref Range  Prostate Specific Ag, Serum 4.5 (H) 0.0 - 4.0 ng/mL   PSA, Free 1.26 N/A ng/mL   PSA, Free Pct 28.0 %      Assessment & Plan:   Problem List Items Addressed This Visit       Cardiovascular and Mediastinum   Aortic atherosclerosis (HCC)    Noted on Lung CT screening.  Recommend continue cessation of smoking.  Continue daily statin and ASA for prevention.      Relevant Medications   rosuvastatin (CRESTOR) 40 MG tablet   Other Relevant Orders   Comprehensive metabolic panel   Lipid Panel w/o Chol/HDL Ratio   Aortic ectasia, abdominal (HCC)    Ongoing noted on imaging.  Continue to monitor BP and ensure good control.  Recommend continue ASA and statin daily, adjust as needed + continue cessation smoking.  Repeat in August 2027.      Relevant Medications   rosuvastatin (CRESTOR) 40 MG tablet   Other Relevant Orders   Comprehensive metabolic panel   Lipid Panel w/o Chol/HDL Ratio   Essential hypertension    Chronic, ongoing BP at goal at home -- he documents daily and brings to all visits with stable levels.  Repeat today at goal.  Will continue focus on DASH diet at home and recommend complete smoking cessation.  Initiate medication as needed if home readings elevated -- consider ARB if needed in future due to mild proteinuria past labs. Labs today: CMP.  Return in 6 months.       Relevant Medications   rosuvastatin (CRESTOR) 40 MG tablet     Respiratory   Centrilobular emphysema (HCC) - Primary    Ongoing.  Noted on CT lung CA screening.  Spirometry January 2023 remains stable FEV1 104% and FEV1/FVC 109%.  Currently no symptoms and no inhaler regimen.  Will plan to initiate inhaler regimen as needed and recommend continue cessation of smoking.  Continue yearly lung screening and spirometry.        Other   Elevated PSA measurement    Recheck today -- is in  normal range for his age and ethnicity.  Suspect some BPH present.      Relevant Orders   PSA, total and free   Hyperlipidemia    Chronic, stable.  Continue current medication regimen and adjust as needed.  Check lipid panel, last LDL >70, may adjust statin dose if ongoing elevation.       Relevant Medications   rosuvastatin (CRESTOR) 40 MG tablet   Other Relevant Orders   Comprehensive metabolic panel   Lipid Panel w/o Chol/HDL Ratio   Nicotine dependence, cigarettes, uncomplicated    He has cut back and only smoking occasionally.  Praised for this.  Continue complete cessation.      Obesity    BMI 30.60.  Recommended eating smaller high protein, low fat meals more frequently and exercising 30 mins a day 5 times a week with a goal of 10-15lb weight loss in the next 3 months. Patient voiced their understanding and motivation to adhere to these recommendations.       Prediabetes    Last A1c 5.7% and urine ALB 14.  Continue diet focus and consider ARB initiation in future.      Relevant Orders   Bayer DCA Hb A1c Waived     Follow up plan: Return in about 6 months (around 10/19/2023) for HTN/HLD, COPD -- will need spirometry + needs Medicare Wellness nurse visit please.

## 2023-04-18 NOTE — Assessment & Plan Note (Signed)
He has cut back and only smoking occasionally.  Praised for this.  Continue complete cessation.

## 2023-04-19 ENCOUNTER — Other Ambulatory Visit: Payer: Self-pay | Admitting: Nurse Practitioner

## 2023-04-19 DIAGNOSIS — R972 Elevated prostate specific antigen [PSA]: Secondary | ICD-10-CM

## 2023-04-19 NOTE — Progress Notes (Signed)
Contacted via MyChart -- lab only visit in 4 weeks please Good morning Jim Bauer, your labs have returned: - Kidney function, creatinine and eGFR, remains normal, as is liver function, AST and ALT.  - Cholesterol levels are at goal.  - Prostate level is trending up more and now above where we would like to see it for your age and ethnicity.  I would like to recheck in 4 weeks and if continues to trend up we will get you into urology.  Any questions? Keep being stellar!!  Thank you for allowing me to participate in your care.  I appreciate you. Kindest regards, Janiel Crisostomo

## 2023-04-19 NOTE — Progress Notes (Signed)
Called and scheduled appointment on 05/16/2023 @ 8:20 am.

## 2023-04-20 ENCOUNTER — Ambulatory Visit: Payer: PPO | Admitting: Nurse Practitioner

## 2023-05-16 ENCOUNTER — Other Ambulatory Visit: Payer: PPO

## 2023-05-16 DIAGNOSIS — R972 Elevated prostate specific antigen [PSA]: Secondary | ICD-10-CM

## 2023-05-17 LAB — PSA, TOTAL AND FREE
PSA, Free Pct: 22.9 %
PSA, Free: 1.03 ng/mL
Prostate Specific Ag, Serum: 4.5 ng/mL — ABNORMAL HIGH (ref 0.0–4.0)

## 2023-05-17 NOTE — Progress Notes (Signed)
Contacted via MyChart  Can we get him scheduled for nurse visit Medicare Wellness please:)  Good morning Jim Bauer, your PSA has returned and has trended back down.  It is elevated based on basic lab results, but based on your age and ethnicity it is in normal range.  We will continue to monitor and next visit I will do prostate exam.  I suspect this is coming from increased size of prostate which happens with aging.  Any questions? Keep being stellar!!  Thank you for allowing me to participate in your care.  I appreciate you. Kindest regards, Justiss Gerbino

## 2023-05-17 NOTE — Progress Notes (Signed)
Pt stated that he was not interested in the Medicare Wellness Visit when I called him to schedule.

## 2023-09-07 ENCOUNTER — Ambulatory Visit: Payer: Self-pay | Admitting: *Deleted

## 2023-09-07 ENCOUNTER — Ambulatory Visit
Admission: EM | Admit: 2023-09-07 | Discharge: 2023-09-07 | Disposition: A | Discharge status: Home / Self Care | Payer: PPO | Attending: Emergency Medicine | Admitting: Emergency Medicine

## 2023-09-07 DIAGNOSIS — J441 Chronic obstructive pulmonary disease with (acute) exacerbation: Secondary | ICD-10-CM

## 2023-09-07 MED ORDER — BENZONATATE 100 MG PO CAPS: 100.0000 mg | ORAL_CAPSULE | Freq: Three times a day (TID) | ORAL | 0 refills | Status: DC

## 2023-09-07 MED ORDER — PROMETHAZINE-DM 6.25-15 MG/5ML PO SYRP: 5.0000 mL | ORAL_SOLUTION | Freq: Every evening | ORAL | 0 refills | Status: DC | PRN

## 2023-09-07 MED ORDER — AZITHROMYCIN 250 MG PO TABS: 250.0000 mg | ORAL_TABLET | Freq: Every day | ORAL | 0 refills | Status: DC

## 2023-09-07 MED ORDER — PREDNISONE 20 MG PO TABS: 40.0000 mg | ORAL_TABLET | Freq: Every day | ORAL | 0 refills | Status: DC

## 2023-09-07 NOTE — Discharge Instructions (Signed)
Symptoms are most likely related to a flare of your COPD  Begin azithromycin to provide coverage for bacteria which most likely is contributing to symptoms  Begin prednisone every morning with food for 5 days to open and relax airway, should settle persistent as of cough and wheezing  You may use Tessalon pill every 8 hours as needed to help calm your coughing, may use cough syrup at bedtime to allow for rest    You can take Tylenol and/or Ibuprofen as needed for fever reduction and pain relief.   For cough: honey 1/2 to 1 teaspoon (you can dilute the honey in water or another fluid).  You can also use guaifenesin and dextromethorphan for cough. You can use a humidifier for chest congestion and cough.  If you don't have a humidifier, you can sit in the bathroom with the hot shower running.      For sore throat: try warm salt water gargles, cepacol lozenges, throat spray, warm tea or water with lemon/honey, popsicles or ice, or OTC cold relief medicine for throat discomfort.   For congestion: take a daily anti-histamine like Zyrtec, Claritin, and a oral decongestant, such as pseudoephedrine.  You can also use Flonase 1-2 sprays in each nostril daily.   It is important to stay hydrated: drink plenty of fluids (water, gatorade/powerade/pedialyte, juices, or teas) to keep your throat moisturized and help further relieve irritation/discomfort.

## 2023-09-07 NOTE — Telephone Encounter (Addendum)
  Chief Complaint: Non productive cough for a week, and wheezing at light when he lays on his side. Symptoms: above Frequency: Since Sat. Pertinent Negatives: Patient denies shortness of breath Disposition: [] ED /[x] Urgent Care (no appt availability in office) / [] Appointment(In office/virtual)/ []  [facility]/ [] Home Care/ [] Refused Recommended Disposition /[] [facility]/ []  Follow-up with PCP Additional Notes: No appts available with [facility] with any of the providers until first week of Jan. 2025.   Erin Mecum, PA-C first appt is Dec. 30, 2024.   I offered him a [facility]gent [facility] Visit appt.   He opted to go to the urgent care instead because he wanted someone to be able to listen to his lungs.   I gave him the location of the [facility] urgent care in [place] on [address], phone number and hours.    He was agreeable to going there.     [facility] not operational today.

## 2023-09-07 NOTE — ED Triage Notes (Signed)
Cough x 1 week. Tried OTC cough syrup and mucinex with no relief.

## 2023-09-07 NOTE — Telephone Encounter (Signed)
Reason for Disposition  [1] Continuous (nonstop) coughing interferes with work or school AND [2] no improvement using cough treatment per Care Advice  Answer Assessment - Initial Assessment Questions 1. ONSET: "When did the cough begin?"      I'm coughing non productive since last Sat.  I'm wheezing at night.  My back and right side makes me cough.   2. SEVERITY: "How bad is the cough today?"      Bad for a week 3. SPUTUM: "Describe the color of your sputum" (none, dry cough; clear, white, yellow, green)     None 4. HEMOPTYSIS: "Are you coughing up any blood?" If so ask: "How much?" (flecks, streaks, tablespoons, etc.)     Not asked 5. DIFFICULTY BREATHING: "Are you having difficulty breathing?" If Yes, ask: "How bad is it?" (e.g., mild, moderate, severe)    - MILD: No SOB at rest, mild SOB with walking, speaks normally in sentences, can lie down, no retractions, pulse < 100.    - MODERATE: SOB at rest, SOB with minimal exertion and prefers to sit, cannot lie down flat, speaks in phrases, mild retractions, audible wheezing, pulse 100-120.    - SEVERE: Very SOB at rest, speaks in single words, struggling to breathe, sitting hunched forward, retractions, pulse > 120      No 6. FEVER: "Do you have a fever?" If Yes, ask: "What is your temperature, how was it measured, and when did it start?"     MNo 7. CARDIAC HISTORY: "Do you have any history of heart disease?" (e.g., heart attack, congestive heart failure)      Not asked 8. LUNG HISTORY: "Do you have any history of lung disease?"  (e.g., pulmonary embolus, asthma, emphysema)     My lung show starts of COPD  9. PE RISK FACTORS: "Do you have a history of blood clots?" (or: recent major surgery, recent prolonged travel, bedridden)     Not asked 10. OTHER SYMPTOMS: "Do you have any other symptoms?" (e.g., runny nose, wheezing, chest pain)       Wheezing at night 11. PREGNANCY: "Is there any chance you are pregnant?" "When was your last menstrual  period?"       N/A 12. TRAVEL: "Have you traveled out of the country in the last month?" (e.g., travel history, exposures)       N/A  Protocols used: Cough - Acute Non-Productive-A-AH

## 2023-09-07 NOTE — ED Provider Notes (Signed)
Jim Bauer    CSN: [id number] Arrival date & time: 09/07/23  6387      History   Chief Complaint Chief Complaint  Patient presents with   Cough    HPI Jim Bauer is a 72 y.o. male.   Patient presents for evaluation of chills, a primarily nonproductive cough and wheezing present for 7 days.  Wheezing worse predominant at nighttime when lying flat.  Denies fever, congestion, ear pain, sore throat.  No known sick contacts.  Tolerating food and liquids.  Has attempted use of over-the-counter medications which has been ineffective.  History of emphysema, daily smoker.  Past Medical History:  Diagnosis Date   Allergy 2015   Centrilobular emphysema (HCC) 10/28/2019   Essential hypertension    Hyperlipidemia    Personal history of tobacco use, presenting hazards to health 08/20/2015    Patient Active Problem List   Diagnosis Date Noted   Elevated PSA measurement 04/15/2023   Epidermoid cyst of finger 10/10/2021   Obesity 09/29/2020   Centrilobular emphysema (HCC) 10/28/2019   Prediabetes 04/10/2019   Essential hypertension 04/10/2019   Pulmonary nodules 04/10/2019   Aortic atherosclerosis (HCC) 04/10/2019   Vitamin D deficiency 04/10/2019   Hx of colonic polyps 04/10/2019   Hyperlipidemia 04/10/2019   Nicotine dependence, cigarettes, uncomplicated 04/10/2019   Coronary artery calcification seen on CT scan 10/11/2016   Aortic ectasia, abdominal (HCC) 08/24/2015    Past Surgical History:  Procedure Laterality Date   COLONOSCOPY     COLONOSCOPY WITH PROPOFOL N/A 06/21/2015   Procedure: COLONOSCOPY WITH PROPOFOL;  Surgeon: Christena Deem, MD;  Location: [facility] ENDOSCOPY;  Service: Endoscopy;  Laterality: N/A;   COLONOSCOPY WITH PROPOFOL N/A 05/07/2019   Procedure: COLONOSCOPY WITH PROPOFOL;  Surgeon: Wyline Mood, MD;  Location: [facility] ENDOSCOPY;  Service: Gastroenterology;  Laterality: N/A;   COLONOSCOPY WITH PROPOFOL N/A 05/15/2022   Procedure: COLONOSCOPY WITH  PROPOFOL;  Surgeon: Wyline Mood, MD;  Location: [facility] ENDOSCOPY;  Service: Gastroenterology;  Laterality: N/A;   COLONOSCOPY WITH PROPOFOL N/A 12/04/2022   Procedure: COLONOSCOPY WITH PROPOFOL;  Surgeon: Wyline Mood, MD;  Location: [facility] ENDOSCOPY;  Service: Gastroenterology;  Laterality: N/A;   KNEE ARTHROSCOPY     TONSILLECTOMY         Home Medications    Prior to Admission medications   Medication Sig Start Date End Date Taking? Authorizing Provider  aspirin EC 81 MG tablet Take 81 mg by mouth daily.   Yes [provider]  azithromycin (ZITHROMAX) 250 MG tablet Take 1 tablet (250 mg total) by mouth daily. Take first 2 tablets together, then 1 every day until finished. 09/07/23  Yes Ailea Rhatigan R, NP  benzonatate (TESSALON) 100 MG capsule Take 1 capsule (100 mg total) by mouth every 8 (eight) hours. 09/07/23  Yes Rochele Lueck, Hansel Starling R, NP  Cholecalciferol (D2000 ULTRA STRENGTH) 50 MCG (2000 UT) CAPS Take 2,000 Units by mouth daily.   Yes [provider]  Omega 3 1000 MG CAPS Take 1 capsule by mouth daily.   Yes [provider]  predniSONE (DELTASONE) 20 MG tablet Take 2 tablets (40 mg total) by mouth daily. 09/07/23  Yes Salvador Coupe, Elita Boone, NP  promethazine-dextromethorphan (PROMETHAZINE-DM) 6.25-15 MG/5ML syrup Take 5 mLs by mouth at bedtime as needed for cough. 09/07/23  Yes Rocsi Hazelbaker R, NP  rosuvastatin (CRESTOR) 40 MG tablet Take 1 tablet (40 mg total) by mouth daily. 04/18/23  Yes Cannady, Jolene T, NP  EPINEPHrine 0.3 mg/0.3 mL IJ SOAJ injection Inject  0.3 mg into the muscle as needed for anaphylaxis. 04/01/21   Marjie Skiff, NP    Family History Family History  Problem Relation Age of Onset   Macular degeneration Mother    Diabetes Father    Parkinson's disease Father    Hypertension Father    Obesity Brother    Sleep apnea Brother    Melanoma Daughter    Diverticulitis Maternal Grandmother    Alcohol abuse Maternal Grandfather    Heart  disease Paternal Grandmother    Stroke Paternal Grandfather     Social History Social History   Tobacco Use   Smoking status: Former    Current packs/day: 0.50    Average packs/day: 0.5 packs/day for 43.0 years (21.5 ttl pk-yrs)    Types: Cigarettes   Smokeless tobacco: Never  Vaping Use   Vaping status: Never Used  Substance Use Topics   Alcohol use: Yes    Alcohol/week: 14.0 standard drinks of alcohol    Types: 14 Glasses of wine per week    Comment: daily   Drug use: Never     Allergies   Bee venom   Review of Systems Review of Systems   Physical Exam Triage Vital Signs ED Triage Vitals [09/07/23 0846]  Encounter Vitals Group     BP (!) 155/90     Systolic BP Percentile      Diastolic BP Percentile      Pulse Rate 83     Resp 18     Temp 98.6 F (37 C)     Temp Source Oral     SpO2 92 %     Weight      Height      Head Circumference      Peak Flow      Pain Score 2     Pain Loc      Pain Education      Exclude from Growth Chart    No data found.  Updated Vital Signs BP (!) 155/90 (BP Location: Left Arm)   Pulse 83   Temp 98.6 F (37 C) (Oral)   Resp 18   SpO2 92%   Visual Acuity Right Eye Distance:   Left Eye Distance:   Bilateral Distance:    Right Eye Near:   Left Eye Near:    Bilateral Near:     Physical Exam Constitutional:      Appearance: Normal appearance.  HENT:     Right Ear: Tympanic membrane, ear canal and external ear normal.     Left Ear: Tympanic membrane, ear canal and external ear normal.     Nose: Congestion present. No rhinorrhea.     Mouth/Throat:     Pharynx: No oropharyngeal exudate or posterior oropharyngeal erythema.  Eyes:     Extraocular Movements: Extraocular movements intact.  Cardiovascular:     Rate and Rhythm: Normal rate and regular rhythm.     Pulses: Normal pulses.     Heart sounds: Normal heart sounds.  Pulmonary:     Effort: Pulmonary effort is normal.     Breath sounds: Normal breath  sounds.  Neurological:     Mental Status: He is alert and oriented to person, place, and time. Mental status is at baseline.      UC Treatments / Results  Labs (all labs ordered are listed, but only abnormal results are displayed) Labs Reviewed - No data to display  EKG   Radiology No results found.  Procedures Procedures (including critical  care time)  Medications Ordered in UC Medications - No data to display  Initial Impression / Assessment and Plan / UC Course  I have reviewed the triage vital signs and the nursing notes.  Pertinent labs & imaging results that were available during my care of the patient were reviewed by me and considered in my medical decision making (see chart for details). \  COPD with acute exacerbation  Patient is in no signs of distress nor toxic appearing.  Vital signs are stable.  Low suspicion for pneumonia, pneumothorax  therefore will defer imaging.  Prescribed azithromycin, prednisone, Tessalon and Promethazine DM.May use additional over-the-counter medications as needed for supportive care.  May follow-up with urgent care as needed if symptoms persist or worsen.   Final Clinical Impressions(s) / UC Diagnoses   Final diagnoses:  COPD with acute exacerbation [facility])     Discharge Instructions      Symptoms are most likely related to a flare of your COPD  Begin azithromycin to provide coverage for bacteria which most likely is contributing to symptoms  Begin prednisone every morning with food for 5 days to open and relax airway, should settle persistent as of cough and wheezing  You may use Tessalon pill every 8 hours as needed to help calm your coughing, may use cough syrup at bedtime to allow for rest    You can take Tylenol and/or Ibuprofen as needed for fever reduction and pain relief.   For cough: honey 1/2 to 1 teaspoon (you can dilute the honey in water or another fluid).  You can also use guaifenesin and dextromethorphan for  cough. You can use a humidifier for chest congestion and cough.  If you don't have a humidifier, you can sit in the bathroom with the hot shower running.      For sore throat: try warm salt water gargles, cepacol lozenges, throat spray, warm tea or water with lemon/honey, popsicles or ice, or OTC cold relief medicine for throat discomfort.   For congestion: take a daily anti-histamine like Zyrtec, Claritin, and a oral decongestant, such as pseudoephedrine.  You can also use Flonase 1-2 sprays in each nostril daily.   It is important to stay hydrated: drink plenty of fluids (water, gatorade/powerade/pedialyte, juices, or teas) to keep your throat moisturized and help further relieve irritation/discomfort.    ED Prescriptions     Medication Sig Dispense Auth. Provider   azithromycin (ZITHROMAX) 250 MG tablet Take 1 tablet (250 mg total) by mouth daily. Take first 2 tablets together, then 1 every day until finished. 6 tablet Amarionna Arca R, NP   predniSONE (DELTASONE) 20 MG tablet Take 2 tablets (40 mg total) by mouth daily. 10 tablet Ranesha Val R, NP   benzonatate (TESSALON) 100 MG capsule Take 1 capsule (100 mg total) by mouth every 8 (eight) hours. 21 capsule Jere Bostrom R, NP   promethazine-dextromethorphan (PROMETHAZINE-DM) 6.25-15 MG/5ML syrup Take 5 mLs by mouth at bedtime as needed for cough. 118 mL Edu On, Elita Boone, NP      PDMP not reviewed this encounter.   Valinda Hoar, [place] 09/07/23 [phone]

## 2023-10-19 ENCOUNTER — Encounter: Payer: Self-pay | Admitting: Nurse Practitioner

## 2023-10-19 ENCOUNTER — Ambulatory Visit (INDEPENDENT_AMBULATORY_CARE_PROVIDER_SITE_OTHER): Payer: PPO | Admitting: Nurse Practitioner

## 2023-10-19 VITALS — BP 127/61 | HR 63 | Temp 98.0°F | Ht 69.5 in | Wt 213.6 lb

## 2023-10-19 DIAGNOSIS — I77811 Abdominal aortic ectasia: Secondary | ICD-10-CM

## 2023-10-19 DIAGNOSIS — E782 Mixed hyperlipidemia: Secondary | ICD-10-CM | POA: Diagnosis not present

## 2023-10-19 DIAGNOSIS — E66811 Obesity, class 1: Secondary | ICD-10-CM

## 2023-10-19 DIAGNOSIS — F17210 Nicotine dependence, cigarettes, uncomplicated: Secondary | ICD-10-CM

## 2023-10-19 DIAGNOSIS — E6609 Other obesity due to excess calories: Secondary | ICD-10-CM

## 2023-10-19 DIAGNOSIS — Z0000 Encounter for general adult medical examination without abnormal findings: Secondary | ICD-10-CM

## 2023-10-19 DIAGNOSIS — I700 Atherosclerosis of aorta: Secondary | ICD-10-CM | POA: Diagnosis not present

## 2023-10-19 DIAGNOSIS — Z6830 Body mass index (BMI) 30.0-30.9, adult: Secondary | ICD-10-CM | POA: Diagnosis not present

## 2023-10-19 DIAGNOSIS — R972 Elevated prostate specific antigen [PSA]: Secondary | ICD-10-CM | POA: Diagnosis not present

## 2023-10-19 DIAGNOSIS — R7303 Prediabetes: Secondary | ICD-10-CM

## 2023-10-19 DIAGNOSIS — I10 Essential (primary) hypertension: Secondary | ICD-10-CM | POA: Diagnosis not present

## 2023-10-19 DIAGNOSIS — Z23 Encounter for immunization: Secondary | ICD-10-CM

## 2023-10-19 DIAGNOSIS — J432 Centrilobular emphysema: Secondary | ICD-10-CM | POA: Diagnosis not present

## 2023-10-19 NOTE — Assessment & Plan Note (Signed)
Noted on Lung CT screening.  Recommend continue cessation of smoking.  Continue daily statin and ASA for prevention.

## 2023-10-19 NOTE — Assessment & Plan Note (Signed)
Chronic, ongoing BP at goal at home -- he documents daily and brings to all visits with stable levels -- elevated in office, white coat.  Will continue focus on DASH diet at home and recommend complete smoking cessation.  Initiate medication as needed if home readings elevated -- consider ARB if needed in future due to mild proteinuria past labs. Labs today: CMP.  Return in 6 months.

## 2023-10-19 NOTE — Assessment & Plan Note (Signed)
Ongoing noted on imaging.  Continue to monitor BP and ensure good control.  Recommend continue ASA and statin daily, adjust as needed + continue cessation smoking.  Repeat in August 2027.

## 2023-10-19 NOTE — Assessment & Plan Note (Signed)
Last A1c 5.6%.  Continue diet focus and consider ARB initiation in future.

## 2023-10-19 NOTE — Assessment & Plan Note (Signed)
Chronic, stable.  Continue current medication regimen and adjust as needed.  Check lipid panel, last LDL >70, may adjust statin dose if ongoing elevation.

## 2023-10-19 NOTE — Patient Instructions (Signed)

## 2023-10-19 NOTE — Assessment & Plan Note (Signed)
He has cut back and only smoking occasionally.  Praised for this.  Continue complete cessation.

## 2023-10-19 NOTE — Assessment & Plan Note (Signed)
BMI 31.09.  Recommended eating smaller high protein, low fat meals more frequently and exercising 30 mins a day 5 times a week with a goal of 10-15lb weight loss in the next 3 months. Patient voiced their understanding and motivation to adhere to these recommendations.  

## 2023-10-19 NOTE — Assessment & Plan Note (Signed)
Improved on recent check and in range for his age.  Prostate exam did note some enlargement on exam, no nodules.  Monitor closely as does have some symptoms, wishes not to start medication at this time but could in future.  PSA next visit.

## 2023-10-19 NOTE — Progress Notes (Signed)
BP 127/61 Comment: home  Pulse 63   Temp 98 F (36.7 C) (Oral)   Ht 5' 9.5" (1.765 m)   Wt 213 lb 9.6 oz (96.9 kg)   SpO2 96%   BMI 31.09 kg/m    Subjective:    Patient ID: Jim Bauer, male    DOB: June 10, 1951, 73 y.o.   MRN: [id number]  HPI: Khallid Pasillas is a 73 y.o. male  Chief Complaint  Patient presents with   COPD   Hyperlipidemia   Hypertension   HYPERTENSION / HYPERLIPIDEMIA Taking Rosuvastatin 40 MG and ASA, no current BP medications.     AAA screening December 2016 noted "Abdominal aortic ectasia 2.8 cm. Ectatic abdominal aorta at risk for aneurysm development. Recommend follow-up by ultrasound in 5 years".  On repeat 04/25/2021 noted ongoing measurement of 2.8 cm to perform 5 year follow-up.   Satisfied with current treatment? yes Duration of hypertension: chronic BP monitoring frequency: daily BP range:102/52 to 127/61 and HR average 60 BP medication side effects: no Duration of hyperlipidemia: chronic Cholesterol medication side effects: no, Cholesterol supplements: fish oil Medication compliance: good compliance Aspirin: yes Recent stressors: no Recurrent headaches: no Visual changes: no Palpitations: no Dyspnea: no Chest pain: no Lower extremity edema: no Dizzy/lightheaded: no   ELEVATED PSA Last check in range for his age, but prior slightly elevated. Duration: years Nocturia: 2-3x per night Urinary frequency:no Incomplete voiding: sometimes Urgency: yes Weak urinary stream:  somewhat weak Straining to start stream: no Dysuria: no Onset: gradual Severity: mild Alleviating factors: nothing Aggravating factors: drinking more fluids Treatments attempted: none IPSS Questionnaire (AUA-7): 6 AUA Over the past month.   1)  How often have you had a sensation of not emptying your bladder completely after you finish urinating?  1 - Less than 1 time in 5  2)  How often have you had to urinate again less than two hours after you finished  urinating? 0 - Not at all  3)  How often have you found you stopped and started again several times when you urinated?  1 - Less than 1 time in 5  4) How difficult have you found it to postpone urination?  1 - Less than 1 time in 5  5) How often have you had a weak urinary stream?  1 - Less than 1 time in 5  6) How often have you had to push or strain to begin urination?  0 - Not at all  7) How many times did you most typically get up to urinate from the time you went to bed until the time you got up in the morning?  2 - 2 times  Total score:  0-7 mildly symptomatic   8-19 moderately symptomatic   20-35 severely symptomatic    Impaired Fasting Glucose HbA1C:  Lab Results  Component Value Date   HGBA1C 5.6 04/18/2023  Duration of elevated blood sugar: months  Polydipsia: no Polyuria: no Weight change: no Visual disturbance: no Glucose Monitoring: no    Accucheck frequency: Not Checking    Fasting glucose:     Post prandial:  Diabetic Education: Not Completed Family history of diabetes: no   COPD Smoking very rarely now.  Has smoked since age 15. CT lung screening noted mild centrilobular emphysema and aortic atherosclerosis, last screening 11/20/22. COPD status: stable Satisfied with current treatment?: yes Oxygen use: no Dyspnea frequency: none Cough frequency: occasional Rescue inhaler frequency:  no Limitation of activity: no Productive cough: none Last Spirometry:  10/10/21 FEV1 104% and FEV1/FVC 105% Pneumovax: Up to Date Influenza: Up to Date     10/19/2023    9:09 AM 04/18/2023   10:26 AM 10/20/2022    8:32 AM 04/10/2022    8:42 AM 10/10/2021    8:53 AM  Depression screen PHQ 2/9  Decreased Interest 0 0 0 0 0  Down, Depressed, Hopeless 0 0 0 0 0  PHQ - 2 Score 0 0 0 0 0  Altered sleeping 0 0 0 0 0  Tired, decreased energy 0 0 0 1 1  Change in appetite 0 0 0 0 0  Feeling bad or failure about yourself  0 0 0 0 0  Trouble concentrating 0 0 0 0 0  Moving slowly or  fidgety/restless 0 0 0 0 0  Suicidal thoughts 0 0 0 0 0  PHQ-9 Score 0 0 0 1 1  Difficult doing work/chores Not difficult at all Not difficult at all Not difficult at all Not difficult at all Not difficult at all       10/19/2023    9:09 AM 04/18/2023   10:26 AM 04/10/2022    8:43 AM 10/10/2021    8:52 AM  GAD 7 : Generalized Anxiety Score  Nervous, Anxious, on Edge 0 0 0 1  Control/stop worrying 0 0 0 0  Worry too much - different things 0 0 0 0  Trouble relaxing 0 0 0 0  Restless 0 0 0 0  Easily annoyed or irritable 0 0 0 0  Afraid - awful might happen 0 0 0 1  Total GAD 7 Score 0 0 0 2  Anxiety Difficulty Not difficult at all Not difficult at all Not difficult at all Not difficult at all   Relevant past medical, surgical, family and social history reviewed and updated as indicated. Interim medical history since our last visit reviewed. Allergies and medications reviewed and updated.  Review of Systems  Constitutional:  Negative for activity change, diaphoresis, fatigue and fever.  Respiratory:  Negative for cough, chest tightness, shortness of breath and wheezing.   Cardiovascular:  Negative for chest pain, palpitations and leg swelling.  Gastrointestinal: Negative.   Neurological: Negative.   Psychiatric/Behavioral: Negative.     Per HPI unless specifically indicated above     Objective:    BP 127/61 Comment: home  Pulse 63   Temp 98 F (36.7 C) (Oral)   Ht 5' 9.5" (1.765 m)   Wt 213 lb 9.6 oz (96.9 kg)   SpO2 96%   BMI 31.09 kg/m   Wt Readings from Last 3 Encounters:  10/19/23 213 lb 9.6 oz (96.9 kg)  04/18/23 210 lb 3.2 oz (95.3 kg)  12/04/22 203 lb (92.1 kg)    Physical Exam Vitals and nursing note reviewed. Exam conducted with a chaperone present.  Constitutional:      General: He is awake. He is not in acute distress.    Appearance: He is well-developed, well-groomed and overweight. He is not ill-appearing.  HENT:     Head: Normocephalic and atraumatic.      Right Ear: Hearing normal. No drainage.     Left Ear: Hearing normal. No drainage.  Eyes:     General: Lids are normal.        Right eye: No discharge.        Left eye: No discharge.     Conjunctiva/sclera: Conjunctivae normal.     Pupils: Pupils are equal, round, and reactive to light.  Neck:  Thyroid: No thyromegaly.     Vascular: No carotid bruit.     Trachea: Trachea normal.  Cardiovascular:     Rate and Rhythm: Normal rate and regular rhythm.     Heart sounds: Normal heart sounds, S1 normal and S2 normal. No murmur heard.    No gallop.  Pulmonary:     Effort: Pulmonary effort is normal. No accessory muscle usage or respiratory distress.     Breath sounds: Normal breath sounds.  Abdominal:     General: Bowel sounds are normal.     Palpations: Abdomen is soft. There is no hepatomegaly or splenomegaly.  Genitourinary:    Prostate: Enlarged (R>L). Not tender and no nodules present.  Musculoskeletal:        General: Normal range of motion.     Cervical back: Normal range of motion and neck supple.     Right lower leg: No edema.     Left lower leg: No edema.  Skin:    General: Skin is warm and dry.     Capillary Refill: Capillary refill takes less than 2 seconds.  Neurological:     Mental Status: He is alert and oriented to person, place, and time.  Psychiatric:        Attention and Perception: Attention normal.        Mood and Affect: Mood normal.        Speech: Speech normal.        Behavior: Behavior normal. Behavior is cooperative.        Thought Content: Thought content normal.    Results for orders placed or performed in visit on 05/16/23  PSA, total and free   Collection Time: 05/16/23  8:21 AM  Result Value Ref Range   Prostate Specific Ag, Serum 4.5 (H) 0.0 - 4.0 ng/mL   PSA, Free 1.03 N/A ng/mL   PSA, Free Pct 22.9 %      Assessment & Plan:   Problem List Items Addressed This Visit       Cardiovascular and Mediastinum   Aortic atherosclerosis  (HCC)   Noted on Lung CT screening.  Recommend continue cessation of smoking.  Continue daily statin and ASA for prevention.      Relevant Orders   Comprehensive metabolic panel   Lipid Panel w/o Chol/HDL Ratio   Aortic ectasia, abdominal (HCC)   Ongoing noted on imaging.  Continue to monitor BP and ensure good control.  Recommend continue ASA and statin daily, adjust as needed + continue cessation smoking.  Repeat in August 2027.      Relevant Orders   Comprehensive metabolic panel   Lipid Panel w/o Chol/HDL Ratio   Essential hypertension   Chronic, ongoing BP at goal at home -- he documents daily and brings to all visits with stable levels -- elevated in office, white coat.  Will continue focus on DASH diet at home and recommend complete smoking cessation.  Initiate medication as needed if home readings elevated -- consider ARB if needed in future due to mild proteinuria past labs. Labs today: CMP.  Return in 6 months.         Respiratory   Centrilobular emphysema (HCC) - Primary   Ongoing.  Noted on CT lung CA screening.  Spirometry January 2023 remains stable FEV1 104% and FEV1/FVC 109%.  Currently no symptoms and no inhaler regimen.  Will plan to initiate inhaler regimen as needed and recommend continue cessation of smoking.  Continue yearly lung screening and spirometry.  Other   Elevated PSA measurement   Improved on recent check and in range for his age.  Prostate exam did note some enlargement on exam, no nodules.  Monitor closely as does have some symptoms, wishes not to start medication at this time but could in future.  PSA next visit.      Hyperlipidemia   Chronic, stable.  Continue current medication regimen and adjust as needed.  Check lipid panel, last LDL >70, may adjust statin dose if ongoing elevation.       Relevant Orders   Comprehensive metabolic panel   Lipid Panel w/o Chol/HDL Ratio   Nicotine dependence, cigarettes, uncomplicated   He has cut back  and only smoking occasionally.  Praised for this.  Continue complete cessation.      Obesity   BMI 31.09.  Recommended eating smaller high protein, low fat meals more frequently and exercising 30 mins a day 5 times a week with a goal of 10-15lb weight loss in the next 3 months. Patient voiced their understanding and motivation to adhere to these recommendations.       Prediabetes   Last A1c 5.6%.  Continue diet focus and consider ARB initiation in future.      Relevant Orders   HgB A1c   Other Visit Diagnoses       Flu vaccine need       Flu vaccine today, educated patient.   Relevant Orders   Flu Vaccine Trivalent High Dose (Fluad) (Completed)        Follow up plan: Return in about 6 months (around 04/17/2024) for HTN/HLD, IFG, COPD .

## 2023-10-19 NOTE — Assessment & Plan Note (Signed)
Ongoing.  Noted on CT lung CA screening.  Spirometry January 2023 remains stable FEV1 104% and FEV1/FVC 109%.  Currently no symptoms and no inhaler regimen.  Will plan to initiate inhaler regimen as needed and recommend continue cessation of smoking.  Continue yearly lung screening and spirometry.

## 2023-10-20 LAB — COMPREHENSIVE METABOLIC PANEL
ALT: 26 [IU]/L (ref 0–44)
AST: 24 [IU]/L (ref 0–40)
Albumin: 4.5 g/dL (ref 3.8–4.8)
Alkaline Phosphatase: 76 [IU]/L (ref 44–121)
BUN/Creatinine Ratio: 21 (ref 10–24)
BUN: 20 mg/dL (ref 8–27)
Bilirubin Total: 0.6 mg/dL (ref 0.0–1.2)
CO2: 25 mmol/L (ref 20–29)
Calcium: 9.7 mg/dL (ref 8.6–10.2)
Chloride: 106 mmol/L (ref 96–106)
Creatinine, Ser: 0.94 mg/dL (ref 0.76–1.27)
Globulin, Total: 1.8 g/dL (ref 1.5–4.5)
Glucose: 84 mg/dL (ref 70–99)
Potassium: 4.7 mmol/L (ref 3.5–5.2)
Sodium: 145 mmol/L — ABNORMAL HIGH (ref 134–144)
Total Protein: 6.3 g/dL (ref 6.0–8.5)
eGFR: 86 mL/min/{1.73_m2} (ref >59)

## 2023-10-20 LAB — LIPID PANEL W/O CHOL/HDL RATIO
Cholesterol, Total: 167 mg/dL (ref 100–199)
HDL: 78 mg/dL (ref >39)
LDL Chol Calc (NIH): 68 mg/dL (ref 0–99)
Triglycerides: 122 mg/dL (ref 0–149)
VLDL Cholesterol Cal: 21 mg/dL (ref 5–40)

## 2023-10-20 LAB — HEMOGLOBIN A1C
Est. average glucose Bld gHb Est-mCnc: 123 mg/dL
Hgb A1c MFr Bld: 5.9 % — ABNORMAL HIGH (ref 4.8–5.6)

## 2023-10-21 ENCOUNTER — Encounter: Payer: Self-pay | Admitting: Nurse Practitioner

## 2023-10-21 NOTE — Progress Notes (Signed)
Contacted via MyChart   Good morning Rosanne Ashing, your labs have returned and overall are stable: - Kidney function, creatinine and eGFR, remains normal, as is liver function, AST and ALT.  - Sodium, salt, level a little elevated.  Recommend heavy focus on increasing water and reducing salt in diet. - A1c remains in prediabetes range, continue healthy diet focus.  No medication changes at this time.  Any questions? Keep being amazing!!  Thank you for allowing me to participate in your care.  I appreciate you. Kindest regards, Dolan Xia

## 2023-10-22 DIAGNOSIS — H2513 Age-related nuclear cataract, bilateral: Secondary | ICD-10-CM | POA: Diagnosis not present

## 2023-11-22 ENCOUNTER — Other Ambulatory Visit: Payer: Self-pay | Admitting: Acute Care

## 2023-11-22 DIAGNOSIS — Z87891 Personal history of nicotine dependence: Secondary | ICD-10-CM

## 2023-11-22 DIAGNOSIS — F17210 Nicotine dependence, cigarettes, uncomplicated: Secondary | ICD-10-CM

## 2023-11-22 DIAGNOSIS — Z122 Encounter for screening for malignant neoplasm of respiratory organs: Secondary | ICD-10-CM

## 2023-12-03 ENCOUNTER — Ambulatory Visit
Admission: RE | Admit: 2023-12-03 | Discharge: 2023-12-03 | Disposition: A | Discharge status: Home / Self Care | Source: Ambulatory Visit | Attending: Nurse Practitioner | Admitting: Nurse Practitioner

## 2023-12-03 DIAGNOSIS — Z87891 Personal history of nicotine dependence: Secondary | ICD-10-CM | POA: Insufficient documentation

## 2023-12-03 DIAGNOSIS — F17210 Nicotine dependence, cigarettes, uncomplicated: Secondary | ICD-10-CM | POA: Insufficient documentation

## 2023-12-03 DIAGNOSIS — Z122 Encounter for screening for malignant neoplasm of respiratory organs: Secondary | ICD-10-CM | POA: Insufficient documentation

## 2023-12-28 ENCOUNTER — Telehealth: Payer: Self-pay | Admitting: Acute Care

## 2023-12-28 NOTE — Telephone Encounter (Addendum)
 Called and spoke to pt and reviewed results. Patient had his LDCT on 12/03/2023 and states around the time of scan he had a "deep cough" but has since resolved. Patient denies any cough, congestion, wheezing, shortness of breath, chest tightness, fever, malaise. Patient also denies coughing while eating or choking on food. Will review results with Kandice Robinsons to determine next steps.    IMPRESSION: 1. Lung-RADS 0, incomplete. Additional lung cancer screening CT images/or comparison to prior chest CT examinations is needed. Development of multifocal clustered "tree-in-bud" nodular opacities which are most consistent with infectious bronchiolitis or aspiration. Recommend appropriate antibiotic therapy with repeat lung cancer screening CT in 6-8 weeks. 2. Aortic atherosclerosis (ICD10-I70.0), coronary artery atherosclerosis and emphysema (ICD10-J43.9).     Electronically Signed   By: Jeronimo Greaves M.D.   On: 12/27/2023 13:41

## 2024-01-03 ENCOUNTER — Telehealth: Payer: Self-pay | Admitting: Acute Care

## 2024-01-03 ENCOUNTER — Encounter: Payer: Self-pay | Admitting: Nurse Practitioner

## 2024-01-03 DIAGNOSIS — R911 Solitary pulmonary nodule: Secondary | ICD-10-CM

## 2024-01-03 NOTE — Telephone Encounter (Signed)
 I have called the patient. He states he had a cough 08/2024, but that he has been better. He did get treated with an antibiotic by his PCP at the time. He currently denies any URI symptoms, no fever, discolored secretions, increased sputum production or worsening baseline shortness of breath.As he is clinically asymptomatic, we will just repeat the scan and re-evaluate.  We will do a follow up scan in 4 weeks from today, which will be within the 6-8 week window recommended. Fax results to PCP and let them know plan is for a follow up CT Chest.  Please schedule follow up scan around 02/02/2024 tore-evaluate imaging. Pt. Verbalized understanding and agreed with the plan. Thanks all.

## 2024-01-03 NOTE — Telephone Encounter (Signed)
 Follow up scan ordered. Results and plan sent to PCP.

## 2024-01-30 DIAGNOSIS — Z86018 Personal history of other benign neoplasm: Secondary | ICD-10-CM | POA: Diagnosis not present

## 2024-01-30 DIAGNOSIS — L570 Actinic keratosis: Secondary | ICD-10-CM | POA: Diagnosis not present

## 2024-01-30 DIAGNOSIS — L820 Inflamed seborrheic keratosis: Secondary | ICD-10-CM | POA: Diagnosis not present

## 2024-01-30 DIAGNOSIS — Z872 Personal history of diseases of the skin and subcutaneous tissue: Secondary | ICD-10-CM | POA: Diagnosis not present

## 2024-01-30 DIAGNOSIS — L578 Other skin changes due to chronic exposure to nonionizing radiation: Secondary | ICD-10-CM | POA: Diagnosis not present

## 2024-01-30 DIAGNOSIS — L720 Epidermal cyst: Secondary | ICD-10-CM | POA: Diagnosis not present

## 2024-01-30 DIAGNOSIS — L821 Other seborrheic keratosis: Secondary | ICD-10-CM | POA: Diagnosis not present

## 2024-01-30 DIAGNOSIS — D485 Neoplasm of uncertain behavior of skin: Secondary | ICD-10-CM | POA: Diagnosis not present

## 2024-02-15 ENCOUNTER — Ambulatory Visit
Admission: RE | Admit: 2024-02-15 | Discharge: 2024-02-15 | Disposition: A | Discharge status: Home / Self Care | Source: Ambulatory Visit | Attending: Acute Care | Admitting: Acute Care

## 2024-02-15 DIAGNOSIS — R911 Solitary pulmonary nodule: Secondary | ICD-10-CM | POA: Diagnosis not present

## 2024-02-15 DIAGNOSIS — J439 Emphysema, unspecified: Secondary | ICD-10-CM | POA: Diagnosis not present

## 2024-02-15 DIAGNOSIS — R918 Other nonspecific abnormal finding of lung field: Secondary | ICD-10-CM | POA: Diagnosis not present

## 2024-03-06 ENCOUNTER — Other Ambulatory Visit: Payer: Self-pay

## 2024-03-06 DIAGNOSIS — Z87891 Personal history of nicotine dependence: Secondary | ICD-10-CM

## 2024-03-06 DIAGNOSIS — Z122 Encounter for screening for malignant neoplasm of respiratory organs: Secondary | ICD-10-CM

## 2024-03-06 DIAGNOSIS — F17210 Nicotine dependence, cigarettes, uncomplicated: Secondary | ICD-10-CM

## 2024-05-10 ENCOUNTER — Other Ambulatory Visit: Payer: Self-pay | Admitting: Nurse Practitioner

## 2024-05-12 NOTE — Telephone Encounter (Signed)
 Requested Prescriptions  Pending Prescriptions Disp Refills   rosuvastatin  (CRESTOR ) 40 MG tablet [Pharmacy Med Name: Rosuvastatin  Calcium  40 MG Oral Tablet] 90 tablet 1    Sig: Take 1 tablet by mouth once daily     Cardiovascular:  Antilipid - Statins 2 Failed - 05/12/2024  2:00 PM      Failed - Valid encounter within last 12 months    Recent Outpatient Visits   None            Failed - Lipid Panel in normal range within the last 12 months    Cholesterol, Total  Date Value Ref Range Status  10/19/2023 167 100 - 199 mg/dL Final   LDL Chol Calc (NIH)  Date Value Ref Range Status  10/19/2023 68 0 - 99 mg/dL Final   HDL  Date Value Ref Range Status  10/19/2023 78 >39 mg/dL Final   Triglycerides  Date Value Ref Range Status  10/19/2023 122 0 - 149 mg/dL Final         Passed - Cr in normal range and within 360 days    Creatinine, Ser  Date Value Ref Range Status  10/19/2023 0.94 0.76 - 1.27 mg/dL Final         Passed - Patient is not pregnant
